# Patient Record
Sex: Female | Born: 1970 | Race: Black or African American | Hispanic: No | Marital: Single | Smoking: Never smoker
Health system: Southern US, Community
[De-identification: ages and names within clinical notes are randomized; demographics above are authoritative.]

## PROBLEM LIST (undated history)

## (undated) DIAGNOSIS — T7840XA Allergy, unspecified, initial encounter: Secondary | ICD-10-CM

## (undated) DIAGNOSIS — I10 Essential (primary) hypertension: Secondary | ICD-10-CM

## (undated) HISTORY — DX: Allergy, unspecified, initial encounter: T78.40XA

## (undated) HISTORY — PX: FOOT SURGERY: SHX648

## (undated) HISTORY — DX: Essential (primary) hypertension: I10

---

## 2001-04-23 ENCOUNTER — Other Ambulatory Visit: Admission: RE | Admit: 2001-04-23 | Discharge: 2001-04-23 | Payer: Self-pay | Admitting: *Deleted

## 2001-07-09 ENCOUNTER — Emergency Department (HOSPITAL_COMMUNITY): Admission: EM | Admit: 2001-07-09 | Discharge: 2001-07-09 | Payer: Self-pay | Admitting: Emergency Medicine

## 2002-05-16 ENCOUNTER — Other Ambulatory Visit: Admission: RE | Admit: 2002-05-16 | Discharge: 2002-05-16 | Payer: Self-pay | Admitting: *Deleted

## 2003-05-22 ENCOUNTER — Other Ambulatory Visit: Admission: RE | Admit: 2003-05-22 | Discharge: 2003-05-22 | Payer: Self-pay | Admitting: *Deleted

## 2003-09-08 ENCOUNTER — Inpatient Hospital Stay (HOSPITAL_COMMUNITY): Admission: AD | Admit: 2003-09-08 | Discharge: 2003-09-08 | Payer: Self-pay | Admitting: Obstetrics and Gynecology

## 2003-11-21 ENCOUNTER — Inpatient Hospital Stay (HOSPITAL_COMMUNITY): Admission: AD | Admit: 2003-11-21 | Discharge: 2003-11-21 | Payer: Self-pay | Admitting: Obstetrics and Gynecology

## 2003-12-04 ENCOUNTER — Inpatient Hospital Stay (HOSPITAL_COMMUNITY): Admission: AD | Admit: 2003-12-04 | Discharge: 2003-12-08 | Payer: Self-pay | Admitting: Obstetrics and Gynecology

## 2003-12-05 ENCOUNTER — Encounter (INDEPENDENT_AMBULATORY_CARE_PROVIDER_SITE_OTHER): Payer: Self-pay | Admitting: *Deleted

## 2003-12-09 ENCOUNTER — Encounter: Admission: RE | Admit: 2003-12-09 | Discharge: 2004-01-08 | Payer: Self-pay | Admitting: Obstetrics and Gynecology

## 2004-01-09 ENCOUNTER — Encounter: Admission: RE | Admit: 2004-01-09 | Discharge: 2004-02-08 | Payer: Self-pay | Admitting: Obstetrics and Gynecology

## 2004-01-15 ENCOUNTER — Other Ambulatory Visit: Admission: RE | Admit: 2004-01-15 | Discharge: 2004-01-15 | Payer: Self-pay | Admitting: Obstetrics and Gynecology

## 2005-01-11 ENCOUNTER — Emergency Department (HOSPITAL_COMMUNITY): Admission: EM | Admit: 2005-01-11 | Discharge: 2005-01-11 | Payer: Self-pay | Admitting: Emergency Medicine

## 2005-02-25 ENCOUNTER — Other Ambulatory Visit: Admission: RE | Admit: 2005-02-25 | Discharge: 2005-02-25 | Payer: Self-pay | Admitting: Obstetrics and Gynecology

## 2009-01-23 ENCOUNTER — Emergency Department (HOSPITAL_COMMUNITY): Admission: EM | Admit: 2009-01-23 | Discharge: 2009-01-24 | Payer: Self-pay | Admitting: Emergency Medicine

## 2009-10-31 ENCOUNTER — Emergency Department (HOSPITAL_COMMUNITY): Admission: EM | Admit: 2009-10-31 | Discharge: 2009-11-01 | Payer: Self-pay | Admitting: Emergency Medicine

## 2010-09-24 NOTE — Discharge Summary (Signed)
[name], [name]                          ACCOUNT NO.:  [id number]   MEDICAL RECORD NO.:  [id number]                   PATIENT TYPE:  INP   LOCATION:  9117                                 FACILITY:  [facility]   PHYSICIAN:  Freddy Finner, M.D.                DATE OF BIRTH:  Mar 14, 1971   DATE OF ADMISSION:  12/04/2003  DATE OF DISCHARGE:  12/08/2003                                 DISCHARGE SUMMARY   ADMITTING DIAGNOSES:  1.  Intrauterine pregnancy at 37 weeks estimated gestational age.  2.  Intrauterine growth retardation with borderline amniotic fluid index.  3.  Positive group B beta streptococcus.   DISCHARGE DIAGNOSES:  1.  Status post low transverse cesarean section secondary to nonreassuring      fetal heart tones.  2.  Viable female infant.   PROCEDURE:  Primary low transverse cesarean section.   REASON FOR ADMISSION:  Please see dictated H&P.   HOSPITAL COURSE:  The patient was a 40 year old African-American single  female gravida 3 para 0 that was admitted to [facility] for  induction of labor.  The patient had been seen in the office earlier that  day and had been placed on the monitor for a nonstress test.  Fetal  deceleration was noted on the monitor.  Biophysical profile had been  performed with scores of 8/8 but AFI was noted to be 7.0 and estimated fetal  weight was less than the 70th percentile with lagging abdominal circumflex  and MCA Dopplers were abnormal.  The patient was then admitted for Cytotec  induction.  On admission fetal heart tones were reactive but shortly after  Cytotec was administered fetal heart tones showed a gradual decrease in  variability and repetitive late decelerations.  Because of the tracing and  inability to tolerate labor, decision was made to proceed with a low  transverse cesarean section.  The patient was then transferred to the  operating room where spinal anesthesia was administered without difficulty.  A low  transverse incision was made with the delivery of a viable female  infant weighing 4 pounds 15 ounces with Apgars of 9 at one minute and 9 at  five minutes.  Umbilical cord pH was 7.26.  The patient tolerated the  procedure well and was taken to the recovery room in stable condition.  Later that morning, the patient was doing well.  She had then been  transferred to the mother-baby unit.  She was complaining about being  hungry.  Vital signs were stable; she was afebrile.  Abdomen was soft with  good return of bowel function.  Fundus was firm and nontender.  Abdominal  dressing was noted to be clean, dry, and intact.  On postoperative day #1  the patient was without complaint.  She was tolerating a regular diet,  ambulating well.  Vital signs were stable; she was afebrile.  Abdominal  dressing had  been removed revealing an incision that was clean, dry, and  intact.  Labs revealed hemoglobin of 11.7; wbc count of 7.1; platelet count  was noted to be 93,000.  On postoperative day #2, the patient did complain  of some dizziness while in the shower.  Vital signs were stable; she was  afebrile.  Abdomen was soft, fundus firm.  Incision was clean, dry, and  intact.  Repeat labs revealed hemoglobin of 12.1; wbc count of 6.9;  platelets were up to 117,000.  On postoperative day #3, the patient was  ambulating well without complaints of dizziness.  Abdomen was soft, fundus  was firm and nontender.  Incision was clean, dry, and intact.  Staples were  removed, discharge instructions were reviewed, and the patient was  discharged home.   CONDITION ON DISCHARGE:  Good.   DIET:  Regular as tolerated.   ACTIVITY:  No heavy lifting, no driving x2 weeks, no vaginal entry.   FOLLOW-UP:  The patient is to follow up in the office in 7-10 days for an  incision check.  She is to call for temperature greater than 100 degrees,  persistent nausea and vomiting, heavy vaginal bleeding, and/or redness or   drainage from the incisional site.   DISCHARGE MEDICATIONS:  1.  Tylox #30 one p.o. q.4-6h. p.r.n.  2.  Motrin 600 mg q.6h.  3.  Prenatal vitamins one p.o. daily.  4.  Colace one p.o. daily p.r.n.     Julio Sicks, N.P.                        Freddy Finner, M.D.    CC/MEDQ  D:  01/08/2004  T:  01/09/2004  Job:  [id number]

## 2010-09-24 NOTE — Op Note (Signed)
[name], [name]                          ACCOUNT NO.:  [id number]   MEDICAL RECORD NO.:  [id number]                   PATIENT TYPE:  INP   LOCATION:  9117                                 FACILITY:  [facility]   PHYSICIAN:  Michelle L. Vincente Poli, M.D.            DATE OF BIRTH:  1970/12/07   DATE OF PROCEDURE:  12/05/2003  DATE OF DISCHARGE:                                 OPERATIVE REPORT   PREOPERATIVE DIAGNOSIS:  Intrauterine pregnancy at term, nonreactive fetal  heart rate.  Intrauterine growth retardation, borderline AFI and positive  group B strep.   POSTOPERATIVE DIAGNOSIS:  Intrauterine pregnancy at term, nonreactive fetal  heart rate.  Intrauterine growth retardation, borderline AFI and positive  group B strep.   OPERATION PERFORMED:  Primary low transverse cesarean section.   SURGEON:  Michelle L. Vincente Poli, M.D.   ANESTHESIA:  Spinal.   ESTIMATED BLOOD LOSS:  500 mL.   FINDINGS:  Female infant in cephalic presentation, Apgars 8 at one and 9 at  five minutes, cord pH of 7.26.   DESCRIPTION OF PROCEDURE:  The patient was taken to the operating room where  she was given a spinal without incident. She was then placed in dorsal  lithotomy position with a leftward tilt. She was prepped and draped in the  usual sterile fashion.  Foley catheter was inserted into the bladder.  A  scalpel was used to make a low transverse incision, carried down to the  fascia.  The fascia was scored in the midline and extended laterally.  The  Pfannenstiel incision was developed, the rectus muscles were separated in  the midline.  The peritoneum was entered bluntly. The peritoneal incision  was then stretched.  The bladder blade was inserted, the lower uterine  segment was identified and a bladder flap was created sharply and digitally.  The bladder blade was readjusted. A low transverse incision was made in the  uterus and the baby was delivered easily with one gentle pull of the vacuum.  The baby  was a female infant with Apgars 8 at one minute and 9 at five  minutes.  The cord was clamped and cut.  The baby was handed to the waiting  pediatrician.  Cord pH was obtained which was 7.26.  Cord blood was then  obtained. The placenta was manually removed and noted to be normal and  intact.  The placenta was sent to pathology for analysis.  The uterus was  exteriorized, cleared of all clots and debris.  The adnexa appeared normal,  the uterus appeared normal.  The uterine incision was closed in one layer  using 0 chromic in continuous running locking stitch. The uterus was  returned to the abdomen and irrigation was performed.  Hemostasis was noted.  The peritoneum was closed using 0 Vicryl in a continuous running stitch.  The rectus muscles were reapproximated using 0 Vicryl.  The fascia was  closed using  0 Vicryl in a continuous running locking stitch starting at  each corner and meeting in the midline.  After irrigation of the  subcutaneous layer, the skin was closed with staples.  All sponge, lap and  instrument counts were correct times two.  The patient tolerated the  procedure well. and went to recovery room in stable condition.                                              Michelle L. Vincente Poli, M.D.   Florestine Avers  D:  12/05/2003  T:  12/05/2003  Job:  [id number]

## 2011-07-25 ENCOUNTER — Other Ambulatory Visit: Payer: Self-pay | Admitting: Obstetrics and Gynecology

## 2012-09-04 ENCOUNTER — Other Ambulatory Visit: Payer: Self-pay | Admitting: Obstetrics and Gynecology

## 2012-09-11 ENCOUNTER — Other Ambulatory Visit: Payer: Self-pay | Admitting: Obstetrics and Gynecology

## 2012-09-11 DIAGNOSIS — R928 Other abnormal and inconclusive findings on diagnostic imaging of breast: Secondary | ICD-10-CM

## 2012-09-27 ENCOUNTER — Ambulatory Visit
Admission: RE | Admit: 2012-09-27 | Discharge: 2012-09-27 | Disposition: A | Discharge status: Home / Self Care | Payer: Managed Care, Other (non HMO) | Source: Ambulatory Visit | Attending: Obstetrics and Gynecology | Admitting: Obstetrics and Gynecology

## 2012-09-27 DIAGNOSIS — R928 Other abnormal and inconclusive findings on diagnostic imaging of breast: Secondary | ICD-10-CM

## 2012-12-10 ENCOUNTER — Encounter (HOSPITAL_COMMUNITY): Payer: Self-pay | Admitting: *Deleted

## 2012-12-10 ENCOUNTER — Emergency Department (HOSPITAL_COMMUNITY)
Admission: EM | Admit: 2012-12-10 | Discharge: 2012-12-10 | Disposition: A | Discharge status: Home / Self Care | Payer: Managed Care, Other (non HMO) | Attending: Emergency Medicine | Admitting: Emergency Medicine

## 2012-12-10 ENCOUNTER — Emergency Department (HOSPITAL_COMMUNITY): Payer: Managed Care, Other (non HMO)

## 2012-12-10 DIAGNOSIS — W1809XA Striking against other object with subsequent fall, initial encounter: Secondary | ICD-10-CM | POA: Insufficient documentation

## 2012-12-10 DIAGNOSIS — S52122A Displaced fracture of head of left radius, initial encounter for closed fracture: Secondary | ICD-10-CM

## 2012-12-10 DIAGNOSIS — Y929 Unspecified place or not applicable: Secondary | ICD-10-CM | POA: Insufficient documentation

## 2012-12-10 DIAGNOSIS — Y9389 Activity, other specified: Secondary | ICD-10-CM | POA: Insufficient documentation

## 2012-12-10 DIAGNOSIS — S52123A Displaced fracture of head of unspecified radius, initial encounter for closed fracture: Secondary | ICD-10-CM | POA: Insufficient documentation

## 2012-12-10 MED ORDER — HYDROCODONE-ACETAMINOPHEN 5-325 MG PO TABS
1.0000 | ORAL_TABLET | Freq: Once | ORAL | Status: AC
  Administered 2012-12-10: 1 via ORAL
  Filled 2012-12-10: qty 1

## 2012-12-10 MED ORDER — HYDROCODONE-ACETAMINOPHEN 5-325 MG PO TABS: 1.0000 | ORAL_TABLET | ORAL | Status: DC | PRN

## 2012-12-10 NOTE — ED Notes (Signed)
Skating around 4 pm, pushed down on left arm, painful, able to get up, continues to have pain from shoulder to hand

## 2012-12-10 NOTE — ED Provider Notes (Signed)
CSN: [id number]     Arrival date & time 12/10/12  0102 History     First MD Initiated Contact with Patient 12/10/12 0135     Chief Complaint  Patient presents with  . Arm Injury    Left   (Consider location/radiation/quality/duration/timing/severity/associated sxs/prior Treatment) HPI Comments: Patient states she was rollerskating around 4:00 this, afternoon.  She was standing still and suddenly fell to the ground, landing on her left arm.  Since that time.  She has had pain in the entire left arm, unable to really locate.  The pain.  She took Tylenol at 4 and at 7 without significant relief.  She denies any numbness or tingling of her arm or hand.  She is range of motion of the shoulder, and wrist, with decreased range of motion at her elbow.  There is no obvious deformity or breaks in the skin  Patient is a 42 y.o. female presenting with arm injury. The history is provided by the patient.  Arm Injury Location:  Elbow Injury: yes   Mechanism of injury: fall   Fall:    Fall occurred:  Standing   Impact surface:  Hard floor   Entrapped after fall: no   Elbow location:  L elbow Pain details:    Quality:  Aching   Radiates to:  L shoulder and L wrist   Severity:  Moderate   Onset quality:  Sudden   Duration:  3 hours   Timing:  Constant   Progression:  Unchanged Chronicity:  New Handedness:  Right-handed Dislocation: no   Foreign body present:  No foreign bodies Prior injury to area:  No Relieved by:  Nothing Worsened by:  Movement Associated symptoms: decreased range of motion   Associated symptoms: no fever     History reviewed. No pertinent past medical history. Past Surgical History  Procedure Laterality Date  . Cesarean section    . Foot surgery     No family history on file. History  Substance Use Topics  . Smoking status: Never Smoker   . Smokeless tobacco: Not on file  . Alcohol Use: Yes     Comment: soc   OB History   Grav Para Term Preterm Abortions TAB  SAB Ect Mult Living                 Review of Systems  Constitutional: Negative for fever and chills.  Musculoskeletal: Negative for joint swelling.  Neurological: Negative for weakness and numbness.  All other systems reviewed and are negative.    Allergies  Review of patient's allergies indicates no known allergies.  Home Medications   Current Outpatient Rx  Name  Route  Sig  Dispense  Refill  . acetaminophen (TYLENOL) 500 MG tablet   Oral   Take 500 mg by mouth every 6 (six) hours as needed for pain (pain).         Marland Kitchen HYDROcodone-acetaminophen (NORCO/VICODIN) 5-325 MG per tablet   Oral   Take 1 tablet by mouth every 4 (four) hours as needed for pain.   30 tablet   0    BP 150/97  Pulse 94  Temp(Src) 98 F (36.7 C) (Oral)  Resp 18  Ht 5\' 8"  (1.727 m)  Wt 151 lb (68.493 kg)  BMI 22.96 kg/m2  SpO2 98% Physical Exam  Nursing note and vitals reviewed. Constitutional: She is oriented to person, place, and time. She appears well-developed and well-nourished.  HENT:  Head: Normocephalic.  Eyes: Pupils are equal, round,  and reactive to light.  Neck: Normal range of motion.  Cardiovascular: Normal rate and regular rhythm.   Pulmonary/Chest: Effort normal and breath sounds normal.  Musculoskeletal: She exhibits tenderness. She exhibits no edema.       Left elbow: She exhibits decreased range of motion. She exhibits no swelling, no deformity and no laceration. Tenderness found.  Neurological: She is alert and oriented to person, place, and time.    ED Course   Procedures (including critical care time)  Labs Reviewed - No data to display Dg Elbow Complete Left  12/10/2012   **ADDENDUM** CREATED: 12/10/2012 01:59:44  Please note that there is a dictation error, this is study with sign and chronically without the original dictated report.  No acute fracture or dislocation is seen about the left elbow. There is no joint effusion.  No soft tissue abnormality.  Osseous  mineralization is normal.  No radiopaque foreign body.  **END ADDENDUM** SIGNED BY: Rise Mu, M.D.  12/10/2012   *RADIOLOGY REPORT*  Clinical Data: Fall.  Left elbow pain.  LEFT ELBOW - COMPLETE 3+ VIEW  Comparison: None.  Findings: The patient has an acute fracture of the neck of the left radius with minimal anterior displacement and impaction.  No other fracture is identified.  Elbow joint effusion is noted.  IMPRESSION: Minimally impacted and anteriorly displaced radial neck fracture with associated elbow joint effusion.   Original Report Authenticated By: Holley Dexter, M.D.   Dg Forearm Left  12/10/2012   *RADIOLOGY REPORT*  Clinical Data: On injury  LEFT FOREARM - 2 VIEW  Comparison: None.  Findings: No acute fracture or dislocation.  Limited views of the left elbow and wrist are unremarkable.  No soft tissue abnormality. Osseous mineralization is normal.  No radiopaque foreign body.  IMPRESSION: Normal radiograph with no acute fracture or dislocation.   Original Report Authenticated By: Rise Mu, M.D.   1. Radial head fracture, closed, left, initial encounter     MDM   wil ontain Xray of elbow and forarm  There are 2 different reading of the results one indicating a fracture the other a more chronic condition but patient has no previous Hx elbow injury Will treat as an acute Fx with ortho follow up in 1-2 days patient has been placed in a sling.  She is told to leave this on at all times for the next 2 days  Arman Filter, NP 12/10/12 [phone]

## 2012-12-11 NOTE — ED Provider Notes (Signed)
Medical screening examination/treatment/procedure(s) were performed by non-physician practitioner and as supervising physician I was immediately available for consultation/collaboration.    Aayat Hajjar R Naidelyn Parrella, MD 12/11/12 0519 

## 2013-06-17 ENCOUNTER — Ambulatory Visit (INDEPENDENT_AMBULATORY_CARE_PROVIDER_SITE_OTHER): Payer: Managed Care, Other (non HMO) | Admitting: Emergency Medicine

## 2013-06-17 ENCOUNTER — Ambulatory Visit: Payer: Managed Care, Other (non HMO)

## 2013-06-17 VITALS — BP 112/80 | HR 82 | Temp 97.4°F | Resp 18 | Ht 68.5 in | Wt 150.0 lb

## 2013-06-17 DIAGNOSIS — R05 Cough: Secondary | ICD-10-CM

## 2013-06-17 DIAGNOSIS — D72819 Decreased white blood cell count, unspecified: Secondary | ICD-10-CM

## 2013-06-17 DIAGNOSIS — R059 Cough, unspecified: Secondary | ICD-10-CM

## 2013-06-17 DIAGNOSIS — R200 Anesthesia of skin: Secondary | ICD-10-CM

## 2013-06-17 DIAGNOSIS — R202 Paresthesia of skin: Secondary | ICD-10-CM

## 2013-06-17 LAB — POCT CBC
Granulocyte percent: 38 %G (ref 37–80)
HEMATOCRIT: 44.6 % (ref 37.7–47.9)
HEMOGLOBIN: 13.8 g/dL (ref 12.2–16.2)
Lymph, poc: 1.7 (ref 0.6–3.4)
MCH, POC: 29 pg (ref 27–31.2)
MCHC: 30.9 g/dL — AB (ref 31.8–35.4)
MCV: 93.8 fL (ref 80–97)
MID (CBC): 0.3 (ref 0–0.9)
MPV: 10.7 fL (ref 0–99.8)
PLATELET COUNT, POC: 212 10*3/uL (ref 142–424)
POC Granulocyte: 1.3 — AB (ref 2–6.9)
POC LYMPH %: 52.4 % — AB (ref 10–50)
POC MID %: 9.6 % (ref 0–12)
RBC: 4.76 M/uL (ref 4.04–5.48)
RDW, POC: 14.1 %
WBC: 3.3 10*3/uL — AB (ref 4.6–10.2)

## 2013-06-17 LAB — POCT SEDIMENTATION RATE: POCT SED RATE: 31 mm/h — AB (ref 0–22)

## 2013-06-17 LAB — POCT INFLUENZA A/B
INFLUENZA A, POC: NEGATIVE
Influenza B, POC: NEGATIVE

## 2013-06-17 NOTE — Progress Notes (Addendum)
Subjective:    Patient ID: [name], female    DOB: 30-Aug-1970, 43 y.o.   MRN: [id number] This chart was scribed for Darlyne Russian, MD by Anastasia Pall, ED Scribe. This patient was seen in room 12 and the patient's care was started at 11:15 AM.  Chief Complaint  Patient presents with  . Cough    3 weeks been on 2 different antibiotics  . Numbness    Burning and tingling in both legs about 3 days    HPI [name] is a 43 y.o. female Pt presents with intermittent, dry cough, onset over 2 weeks ago. She reports being seen by [facility] around 12 days ago, received Tessalon pearls and cough medicine. She reports 10 days ago her cough had subsided, but 2 days later she reports her cough returned in the evening She denies coughing up sputum at that time, but reports gradually increasing green sputum since then. She reports last week having fever, but denies fever over the last few days. She reports receiving Amoxicillin and more cough syrup, without relief. She reports intermittent episodes of LE burning and tingling 4 days ago, and having trouble sleeping due to the episodes waking her up. She denies her LE being sore to the touch. She reports being taken off Amoxicillin, put on Doxycycline. She reports her tingling returned again last night. She reports 3 full days of Doxycycline, but denies taking it today. She reports having flu immunization 12/14. She reports being on Mirena, not having menstrual period in years. She denies possibility of being pregnant.   PCP -  No primary provider on file.  There are no active problems to display for this patient.  Past Medical History  Diagnosis Date  . Allergy    Past Surgical History  Procedure Laterality Date  . Cesarean section    . Foot surgery    . Brain surgery     No Known Allergies Prior to Admission medications   Medication Sig Start Date End Date Taking? Authorizing Provider  doxycycline (VIBRAMYCIN) 100 MG capsule Take  100 mg by mouth 2 (two) times daily.   Yes Historical Provider, MD  acetaminophen (TYLENOL) 500 MG tablet Take 500 mg by mouth every 6 (six) hours as needed for pain (pain).    Historical Provider, MD  amoxicillin (AMOXIL) 875 MG tablet Take 875 mg by mouth 2 (two) times daily.    Historical Provider, MD  guaiFENesin-codeine [facility] C-NR) 100-10 MG/5ML syrup Take 5 mLs by mouth 3 (three) times daily as needed for cough.    Historical Provider, MD   Review of Systems  Constitutional: Positive for fever.  Respiratory: Positive for cough (minimally productive of green sputum).   Musculoskeletal: Negative for myalgias.  Neurological:       + for intermittent burning and tingling in bilateral LE.        Objective:   Physical Exam Nursing note and vitals reviewed. Constitutional: Pt is oriented to person, place, and time. Pt appears well-developed and well-nourished. No distress.  HENT:  Head: Normocephalic and atraumatic.  Eyes: EOM are normal. Pupils are equal, round, and reactive to light.  Neck: Neck supple. Cardiovascular: Normal rate, regular rhythm and normal heart sounds.  Exam reveals no gallop and no friction rub. No murmur heard. Pulmonary/Chest: Effort normal and breath sounds normal. No respiratory distress. Pt has no wheezes. Pt has no rales.  Abdominal:  Musculoskeletal: Normal range of motion. Bilateral LE non tender to palpation.  Neurological: Pt  is alert and oriented to person, place, and time. Reflexes, sensation, and strength normal in bilateral LE.  Skin: Skin is warm and dry.  Psychiatric: Pt has a normal mood and affect. Pt's behavior is normal.   BP 112/80  Pulse 82  Temp(Src) 97.4 F (36.3 C) (Oral)  Resp 18  Ht 5' 8.5" (1.74 m)  Wt 150 lb (68.04 kg)  BMI 22.47 kg/m2  SpO2  [facility] reading (PRIMARY) by  Dr. Everlene Farrier no acute disease  Results for orders placed in visit on 06/17/13  POCT CBC      Result Value Range   WBC 3.3 (*) 4.6 - 10.2 K/uL   Lymph, poc 1.7   0.6 - 3.4   POC LYMPH PERCENT 52.4 (*) 10 - 50 %L   MID (cbc) 0.3  0 - 0.9   POC MID % 9.6  0 - 12 %M   POC Granulocyte 1.3 (*) 2 - 6.9   Granulocyte percent 38.0  37 - 80 %G   RBC 4.76  4.04 - 5.48 M/uL   Hemoglobin 13.8  12.2 - 16.2 g/dL   HCT, POC 44.6  37.7 - 47.9 %   MCV 93.8  80 - 97 fL   MCH, POC 29.0  27 - 31.2 pg   MCHC 30.9 (*) 31.8 - 35.4 g/dL   RDW, POC 14.1     Platelet Count, POC 212  142 - 424 K/uL   MPV 10.7  0 - 99.8 fL  POCT INFLUENZA A/B      Result Value Range   Influenza A, POC Negative     Influenza B, POC Negative         Assessment & Plan:  I told her I was concerned about the numbness in the tops of her thighs. I told her I did not know if this was medication related or related to her illness or a developing neurological problem. I told her I would make an appointment for her to see the neurologist. She was alerted to contact me if she developed any progressive symptoms or worsening of her neurological complaints. I checked the sensation on the thighs of both legs and there was no numbness involving the anterior thighs lower extremities or upper extremities.

## 2013-06-17 NOTE — Patient Instructions (Addendum)
I have made a referral for you to see a neurologist. If you have progressive numbness or weakness in her legs please return to clinic immediately for reevaluation

## 2013-06-18 LAB — HIV ANTIBODY (ROUTINE TESTING W REFLEX): HIV: NONREACTIVE

## 2013-08-14 ENCOUNTER — Ambulatory Visit: Payer: Managed Care, Other (non HMO) | Admitting: Podiatry

## 2013-08-14 ENCOUNTER — Encounter: Payer: Self-pay | Admitting: Podiatry

## 2013-08-14 VITALS — BP 132/94 | HR 84 | Resp 16 | Ht 68.5 in | Wt 150.0 lb

## 2013-08-14 DIAGNOSIS — L84 Corns and callosities: Secondary | ICD-10-CM

## 2013-08-14 NOTE — Progress Notes (Signed)
   Subjective:    Patient ID: [name], female    DOB: 16-May-1970, 43 y.o.   MRN: [id number]  HPI  N corns                           L left 4th lateral, and 5th medial toe                           D and O about September 2014                           C painful rubbing hard skin                           A enclosed shoes                           T hx of debridement and silicone toe sleeves  Patient presents for ongoing debridement of painful hyperkeratotic lesions in the fourth left fifth left toe. She states that she does not want to have surgical treatment at this time. She was last seen for this problem on 09/10/2012  She request gelcaps to apply to the fourth toe   Review of Systems  All other systems reviewed and are negative.      Objective:   Physical Exam  Orientated x20 43 year old black female   Dermatological: Keratoses noted on the fourth and fifth left toes.            Assessment & Plan:   Assessment: Hyperkeratoses fourth and fifth left toes associated with hammertoes  Plan: Keratoses x2 were debrided without a bleeding Gelcaps x3 dispensed at patient's request to apply to to wear in the fourth left toe.  Reappoint at patient's request

## 2013-09-09 ENCOUNTER — Telehealth: Payer: Self-pay | Admitting: *Deleted

## 2013-09-09 NOTE — Telephone Encounter (Signed)
I saw Dr. Amalia Hailey a few weeks ago.  I know I need surgery.  Can I get some general information?  How long will I be out of work?  I returned her call.  I asked her if she stands or sits on her job.  She stated she sits.  I informed her she should be able to go back to work after about a week as long as she can sit and elevate her foot.  She asked how she could do that.  I told her bring another chair or stool to her desk and prop the foot on it.  She stated that's all she needed to know.  She said she knows Dr. Amalia Hailey no longer does surgery.  She said she would check her calendar and call back to schedule a consultation with Dr. Blenda Mounts, who Dr. Amalia Hailey referred her to.

## 2013-10-31 ENCOUNTER — Other Ambulatory Visit: Payer: Self-pay | Admitting: Obstetrics and Gynecology

## 2013-11-01 LAB — CYTOLOGY - PAP

## 2014-05-08 IMAGING — CR DG FOREARM 2V*L*
2 series · 2 of 2 positions shown · non-contrast
Comparison: None.

CLINICAL DATA: On injury

LEFT FOREARM - 2 VIEW

[x forearm ap left]
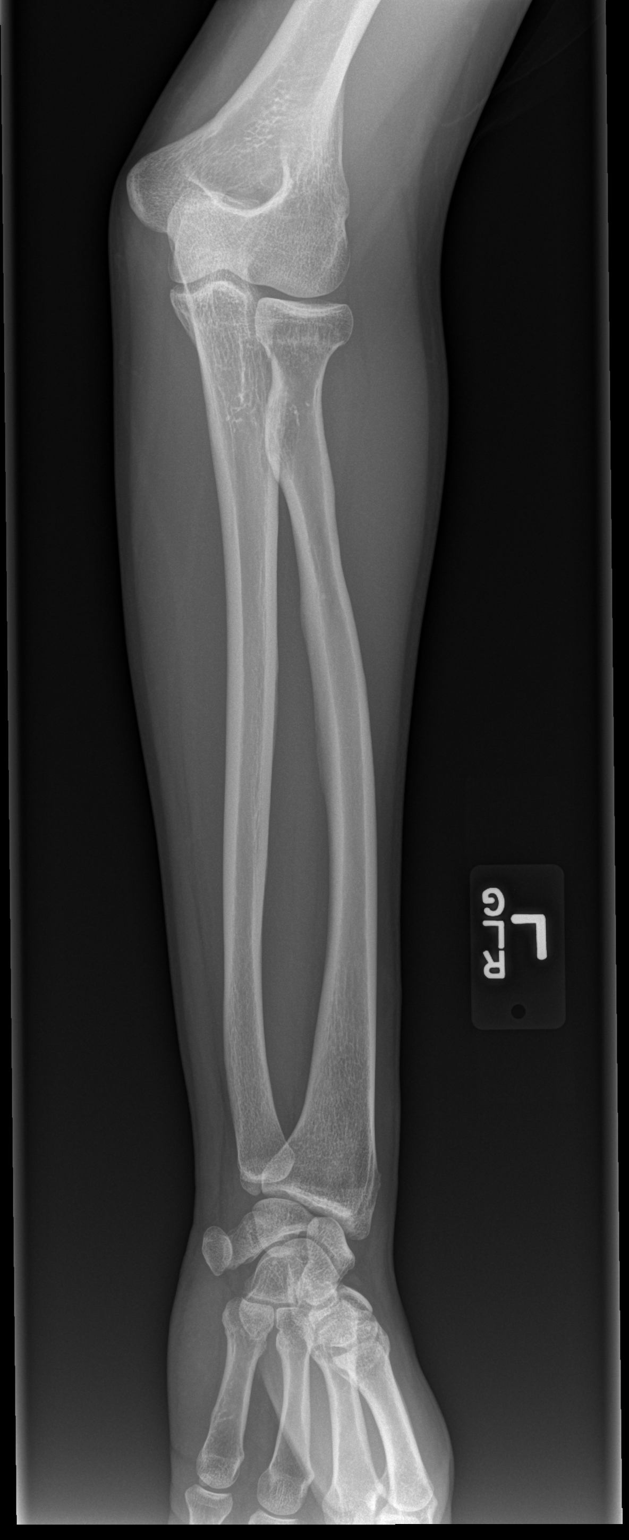

[x forearm lat left]
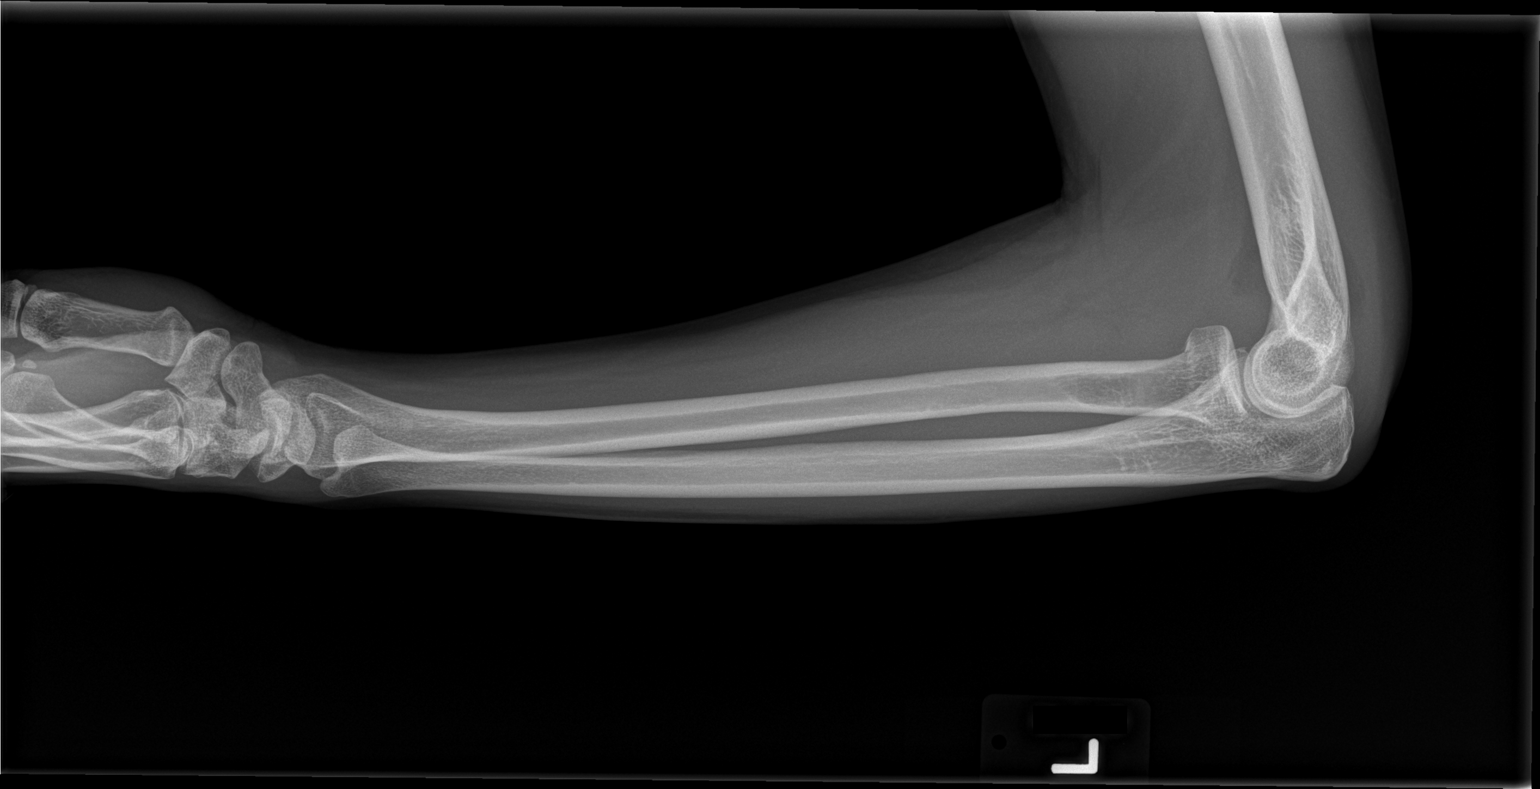

[2 of 2 positions shown; findings below may reference images not displayed]

FINDINGS: No acute fracture or dislocation.  Limited views of the
left elbow and wrist are unremarkable.  No soft tissue abnormality.
Osseous mineralization is normal.  No radiopaque foreign body.
IMPRESSION: Normal radiograph with no acute fracture or dislocation.

## 2014-11-03 ENCOUNTER — Other Ambulatory Visit: Payer: Self-pay | Admitting: Obstetrics and Gynecology

## 2014-11-04 LAB — CYTOLOGY - PAP

## 2014-11-13 IMAGING — CR DG CHEST 2V
2 series · 2 of 2 positions shown · non-contrast
Comparison: None

CLINICAL DATA: Cough.

EXAM:
CHEST - 2 VIEW

[lateral]
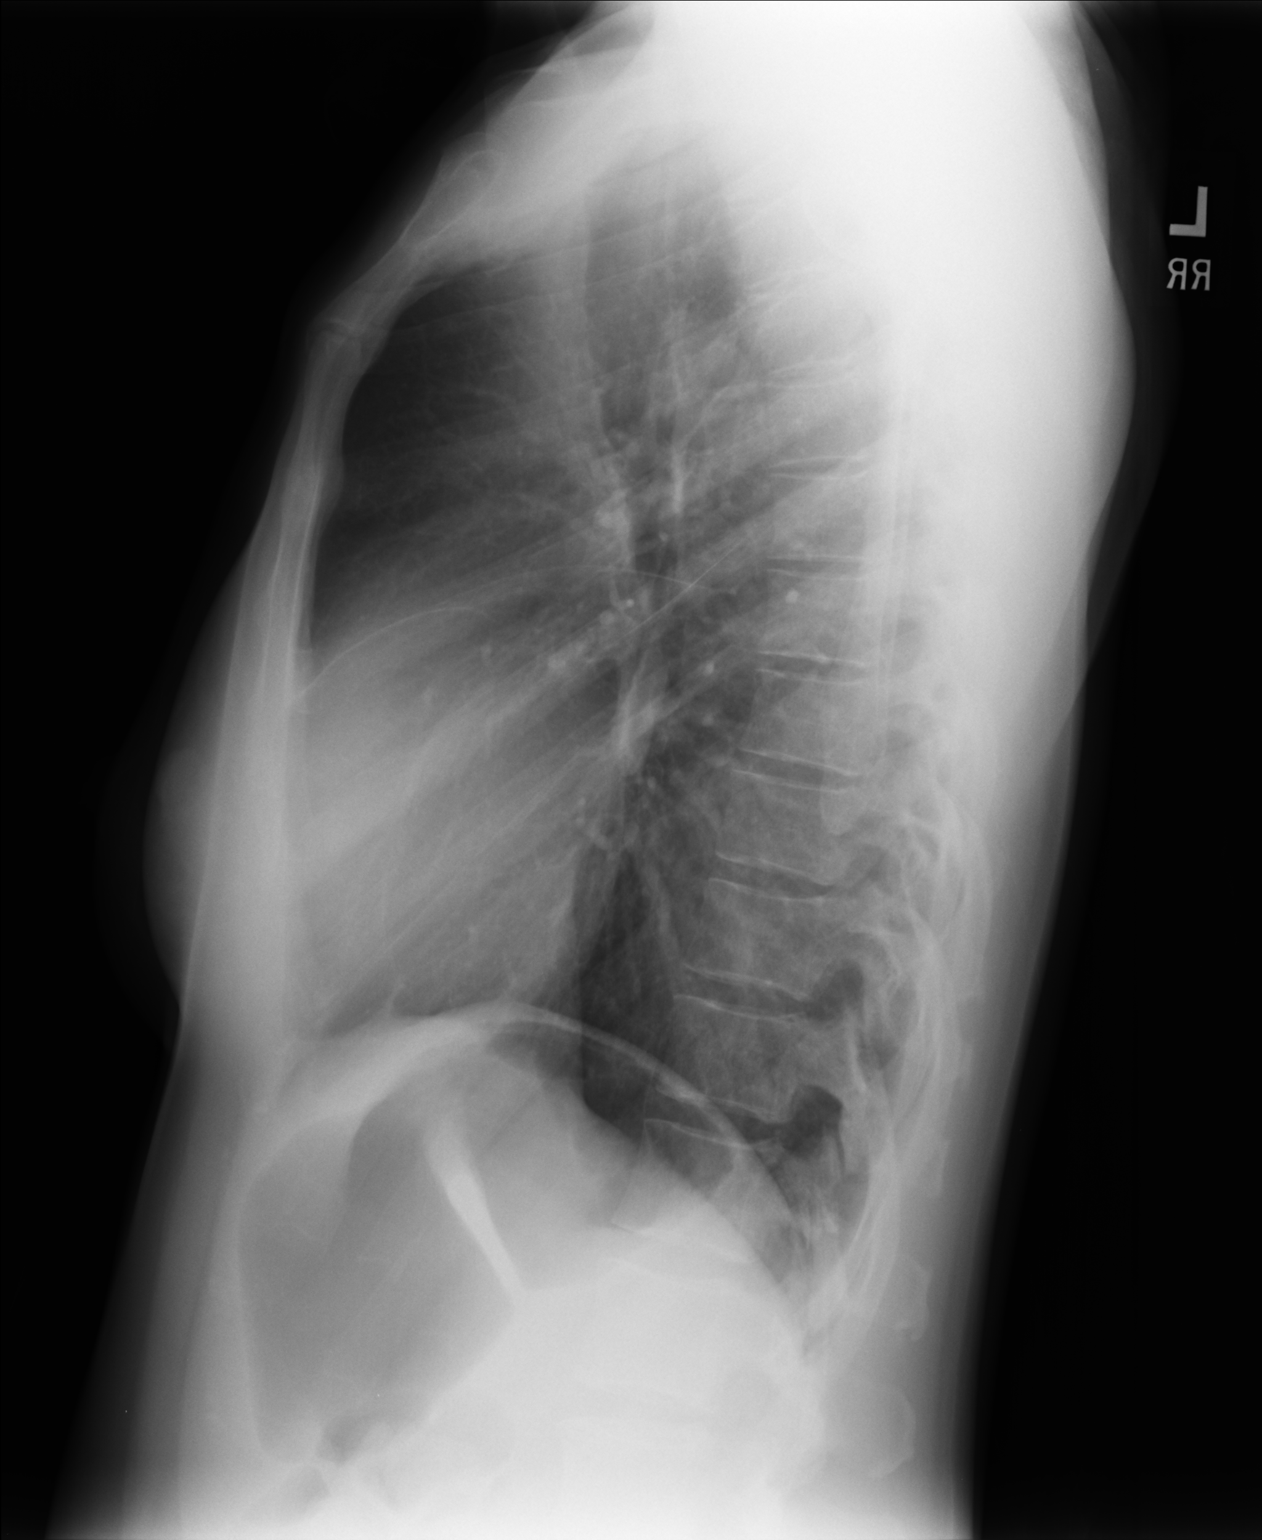

[PA]
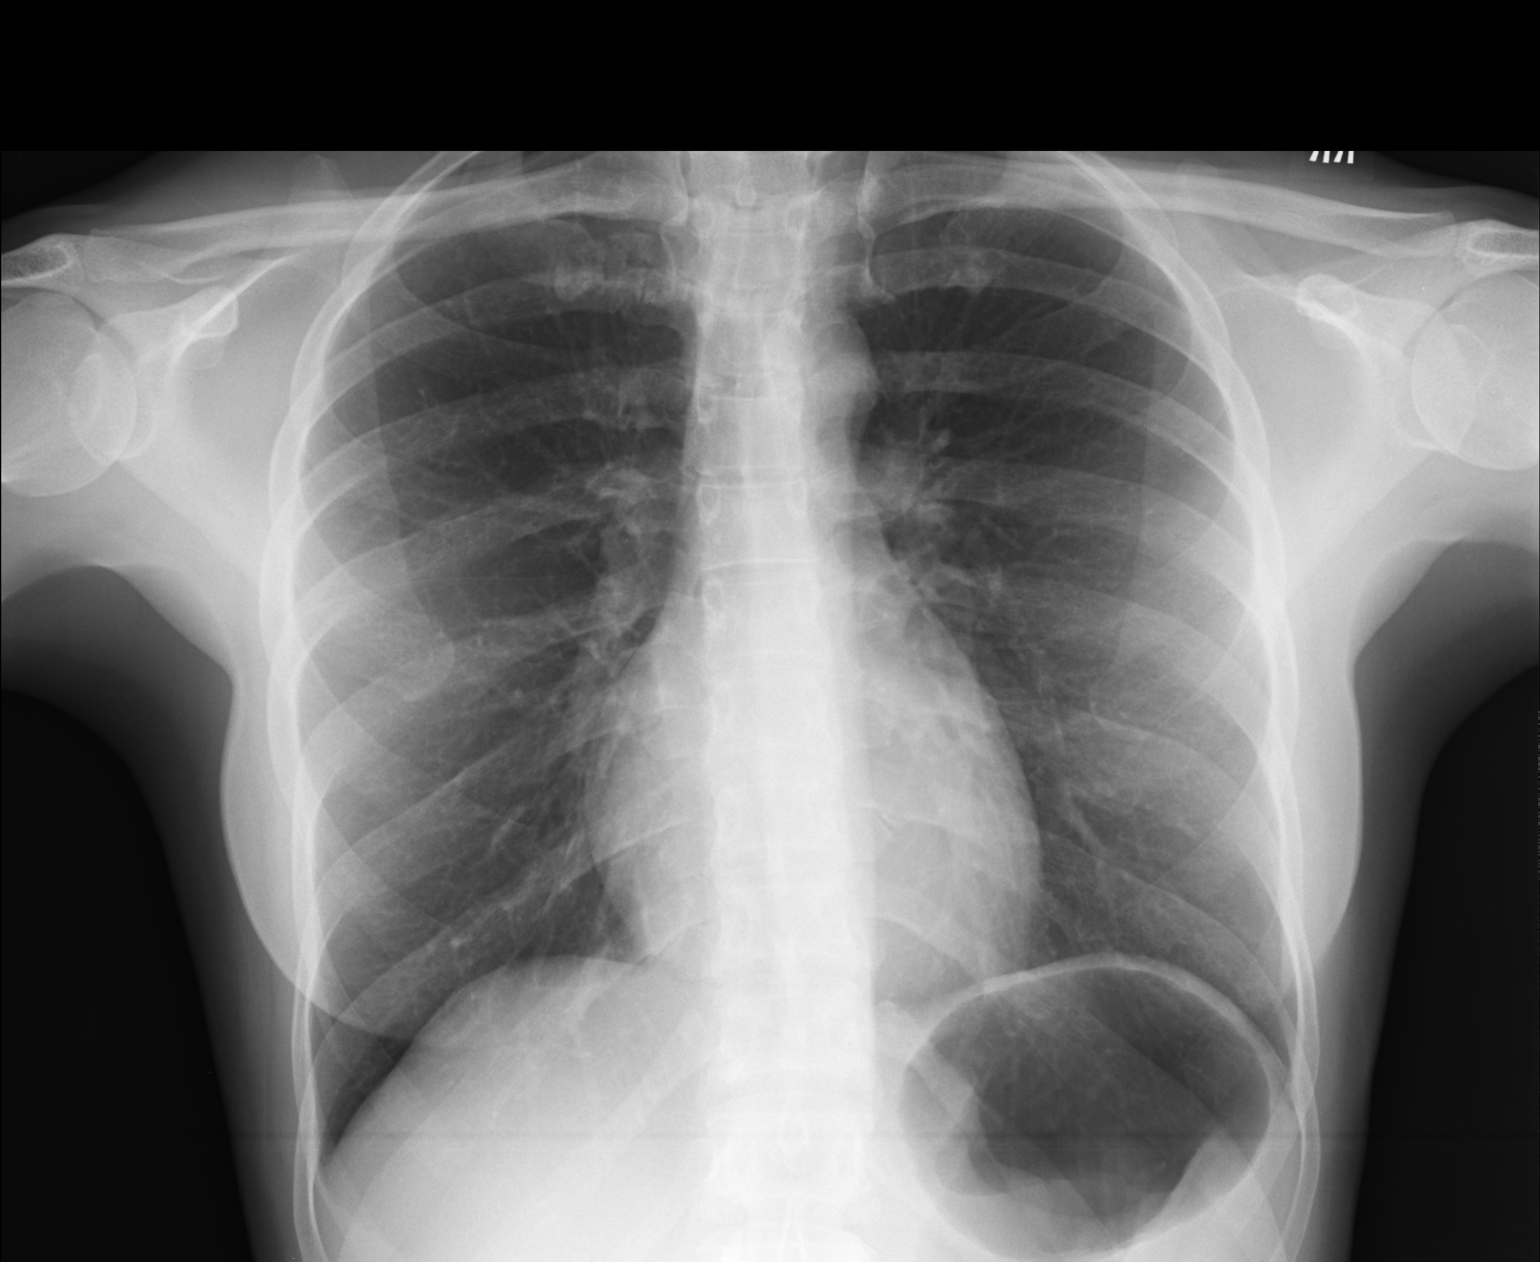

[2 of 2 positions shown; findings below may reference images not displayed]

FINDINGS: The heart size and mediastinal contours are within normal limits.
There is no evidence of pulmonary edema, consolidation,
pneumothorax, nodule or pleural fluid. The visualized skeletal
structures are unremarkable.
IMPRESSION: No active disease.

## 2015-10-20 ENCOUNTER — Ambulatory Visit: Payer: Self-pay | Admitting: Physician Assistant

## 2015-10-21 ENCOUNTER — Encounter: Payer: Self-pay | Admitting: Physician Assistant

## 2015-10-21 ENCOUNTER — Ambulatory Visit (INDEPENDENT_AMBULATORY_CARE_PROVIDER_SITE_OTHER): Payer: Managed Care, Other (non HMO) | Admitting: Physician Assistant

## 2015-10-21 VITALS — BP 118/64 | HR 71 | Temp 97.9°F | Resp 16 | Ht 68.5 in | Wt 150.6 lb

## 2015-10-21 DIAGNOSIS — R5383 Other fatigue: Secondary | ICD-10-CM

## 2015-10-21 DIAGNOSIS — G4700 Insomnia, unspecified: Secondary | ICD-10-CM | POA: Diagnosis not present

## 2015-10-21 DIAGNOSIS — I10 Essential (primary) hypertension: Secondary | ICD-10-CM

## 2015-10-21 DIAGNOSIS — Z79899 Other long term (current) drug therapy: Secondary | ICD-10-CM

## 2015-10-21 DIAGNOSIS — Z130 Encounter for screening for diseases of the blood and blood-forming organs and certain disorders involving the immune mechanism: Secondary | ICD-10-CM

## 2015-10-21 DIAGNOSIS — T7840XA Allergy, unspecified, initial encounter: Secondary | ICD-10-CM | POA: Insufficient documentation

## 2015-10-21 DIAGNOSIS — Z136 Encounter for screening for cardiovascular disorders: Secondary | ICD-10-CM

## 2015-10-21 DIAGNOSIS — Z13220 Encounter for screening for lipoid disorders: Secondary | ICD-10-CM

## 2015-10-21 DIAGNOSIS — R6889 Other general symptoms and signs: Secondary | ICD-10-CM | POA: Diagnosis not present

## 2015-10-21 DIAGNOSIS — E559 Vitamin D deficiency, unspecified: Secondary | ICD-10-CM

## 2015-10-21 DIAGNOSIS — Z0001 Encounter for general adult medical examination with abnormal findings: Secondary | ICD-10-CM | POA: Diagnosis not present

## 2015-10-21 DIAGNOSIS — Z1389 Encounter for screening for other disorder: Secondary | ICD-10-CM

## 2015-10-21 DIAGNOSIS — Z9109 Other allergy status, other than to drugs and biological substances: Secondary | ICD-10-CM | POA: Insufficient documentation

## 2015-10-21 DIAGNOSIS — Z0000 Encounter for general adult medical examination without abnormal findings: Secondary | ICD-10-CM

## 2015-10-21 DIAGNOSIS — Z131 Encounter for screening for diabetes mellitus: Secondary | ICD-10-CM

## 2015-10-21 LAB — CBC WITH DIFFERENTIAL/PLATELET
BASOS PCT: 1 %
Basophils Absolute: 40 cells/uL (ref 0–200)
EOS ABS: 40 {cells}/uL (ref 15–500)
Eosinophils Relative: 1 %
HEMATOCRIT: 40 % (ref 35.0–45.0)
HEMOGLOBIN: 13.4 g/dL (ref 11.7–15.5)
LYMPHS ABS: 1640 {cells}/uL (ref 850–3900)
Lymphocytes Relative: 41 %
MCH: 29.8 pg (ref 27.0–33.0)
MCHC: 33.5 g/dL (ref 32.0–36.0)
MCV: 88.9 fL (ref 80.0–100.0)
MONO ABS: 280 {cells}/uL (ref 200–950)
MPV: 10.5 fL (ref 7.5–12.5)
Monocytes Relative: 7 %
NEUTROS ABS: 2000 {cells}/uL (ref 1500–7800)
Neutrophils Relative %: 50 %
Platelets: 189 10*3/uL (ref 140–400)
RBC: 4.5 MIL/uL (ref 3.80–5.10)
RDW: 13.7 % (ref 11.0–15.0)
WBC: 4 10*3/uL (ref 3.8–10.8)

## 2015-10-21 MED ORDER — TRAZODONE HCL 50 MG PO TABS: ORAL_TABLET | ORAL | Status: DC

## 2015-10-21 NOTE — Patient Instructions (Addendum)
Can try melatonin 5mg -15 mg at night for sleep get the dissolvable or gummy, can also do benadryl 25-50mg  at night for sleep.  If this does not help we can try prescription medication.  Also here is some information about good sleep hygiene.   Insomnia Insomnia is frequent trouble falling and/or staying asleep. Insomnia can be a long term problem or a short term problem. Both are common. Insomnia can be a short term problem when the wakefulness is related to a certain stress or worry. Long term insomnia is often related to ongoing stress during waking hours and/or poor sleeping habits. Overtime, sleep deprivation itself can make the problem worse. Every little thing feels more severe because you are overtired and your ability to cope is decreased. CAUSES   Stress, anxiety, and depression.  Poor sleeping habits.  Distractions such as TV in the bedroom.  Naps close to bedtime.  Engaging in emotionally charged conversations before bed.  Technical reading before sleep.  Alcohol and other sedatives. They may make the problem worse. They can hurt normal sleep patterns and normal dream activity.  Stimulants such as caffeine for several hours prior to bedtime.  Pain syndromes and shortness of breath can cause insomnia.  Exercise late at night.  Changing time zones may cause sleeping problems (jet lag). It is sometimes helpful to have someone observe your sleeping patterns. They should look for periods of not breathing during the night (sleep apnea). They should also look to see how long those periods last. If you live alone or observers are uncertain, you can also be observed at a sleep clinic where your sleep patterns will be professionally monitored. Sleep apnea requires a checkup and treatment. Give your caregivers your medical history. Give your caregivers observations your family has made about your sleep.  SYMPTOMS   Not feeling rested in the morning.  Anxiety and restlessness at  bedtime.  Difficulty falling and staying asleep. TREATMENT   Your caregiver may prescribe treatment for an underlying medical disorders. Your caregiver can give advice or help if you are using alcohol or other drugs for self-medication. Treatment of underlying problems will usually eliminate insomnia problems.  Medications can be prescribed for short time use. They are generally not recommended for lengthy use.  Over-the-counter sleep medicines are not recommended for lengthy use. They can be habit forming.  You can promote easier sleeping by making lifestyle changes such as:  Using relaxation techniques that help with breathing and reduce muscle tension.  Exercising earlier in the day.  Changing your diet and the time of your last meal. No night time snacks.  Establish a regular time to go to bed.  Counseling can help with stressful problems and worry.  Soothing music and white noise may be helpful if there are background noises you cannot remove.  Stop tedious detailed work at least one hour before bedtime. HOME CARE INSTRUCTIONS   Keep a diary. Inform your caregiver about your progress. This includes any medication side effects. See your caregiver regularly. Take note of:  Times when you are asleep.  Times when you are awake during the night.  The quality of your sleep.  How you feel the next day. This information will help your caregiver care for you.  Get out of bed if you are still awake after 15 minutes. Read or do some quiet activity. Keep the lights down. Wait until you feel sleepy and go back to bed.  Keep regular sleeping and waking hours. Avoid naps.  Exercise regularly.  Avoid distractions at bedtime. Distractions include watching television or engaging in any intense or detailed activity like attempting to balance the household checkbook.  Develop a bedtime ritual. Keep a familiar routine of bathing, brushing your teeth, climbing into bed at the same time  each night, listening to soothing music. Routines increase the success of falling to sleep faster.  Use relaxation techniques. This can be using breathing and muscle tension release routines. It can also include visualizing peaceful scenes. You can also help control troubling or intruding thoughts by keeping your mind occupied with boring or repetitive thoughts like the old concept of counting sheep. You can make it more creative like imagining planting one beautiful flower after another in your backyard garden.  During your day, work to eliminate stress. When this is not possible use some of the previous suggestions to help reduce the anxiety that accompanies stressful situations. MAKE SURE YOU:   Understand these instructions.  Will watch your condition.  Will get help right away if you are not doing well or get worse. Document Released: 04/22/2000 Document Revised: 07/18/2011 Document Reviewed: 05/23/2007 [facility] Patient Information 2015 [place], [place]. This information is not intended to replace advice given to you by your health care provider. Make sure you discuss any questions you have with your health care provider.  Fatigue Fatigue is feeling tired all of the time, a lack of energy, or a lack of motivation. Occasional or mild fatigue is often a normal response to activity or life in general. However, long-lasting (chronic) or extreme fatigue may indicate an underlying medical condition. HOME CARE INSTRUCTIONS  Watch your fatigue for any changes. The following actions may help to lessen any discomfort you are feeling: Talk to your health care provider about how much sleep you need each night. Try to get the required amount every night. Take medicines only as directed by your health care provider. Eat a healthy and nutritious diet. Ask your health care provider if you need help changing your diet. Drink enough fluid to keep your urine clear or pale yellow. Practice ways of relaxing,  such as yoga, meditation, massage therapy, or acupuncture. Exercise regularly.  Change situations that cause you stress. Try to keep your work and personal routine reasonable. Do not abuse illegal drugs. Limit alcohol intake to no more than 1 drink per day for nonpregnant women and 2 drinks per day for men. One drink equals 12 ounces of beer, 5 ounces of wine, or 1 ounces of hard liquor. Take a multivitamin, if directed by your health care provider. SEEK MEDICAL CARE IF:  Your fatigue does not get better. You have a fever.  You have unintentional weight loss or gain. You have headaches.  You have difficulty:  Falling asleep. Sleeping throughout the night. You feel angry, guilty, anxious, or sad.  You are unable to have a bowel movement (constipation).  You skin is dry.  Your legs or another part of your body is swollen.  SEEK IMMEDIATE MEDICAL CARE IF:  You feel confused.  Your vision is blurry. You feel faint or pass out.  You have a severe headache.  You have severe abdominal, pelvic, or back pain.  You have chest pain, shortness of breath, or an irregular or fast heartbeat.  You are unable to urinate or you urinate less than normal.  You develop abnormal bleeding, such as bleeding from the rectum, vagina, nose, lungs, or nipples. You vomit blood.  You have thoughts about harming yourself or  committing suicide.  You are worried that you might harm someone else.    This information is not intended to replace advice given to you by your health care provider. Make sure you discuss any questions you have with your health care provider.   Document Released: 02/20/2007 Document Revised: 05/16/2014 Document Reviewed: 08/27/2013 Elsevier Interactive Patient Education [employer].

## 2015-10-21 NOTE — Progress Notes (Signed)
Complete Physical  Assessment and Plan: 1. Screening cholesterol level - Lipid panel  2. Screening for blood or protein in urine - Urinalysis, Routine w reflex microscopic (not at [facility]) - Microalbumin / creatinine urine ratio  3. Screening for diabetes mellitus - Hemoglobin A1c - Insulin, fasting  4. Vitamin D deficiency - VITAMIN D 25 Hydroxy (Vit-D Deficiency, Fractures)  5. Insomnia - will try trazodone, if not tolerated will try belsomra  6. Screening for deficiency anemia - Iron and TIBC - Ferritin - Vitamin B12  7. Routine general medical examination at a health care facility - CBC with Differential/Platelet - BASIC METABOLIC PANEL WITH GFR - Hepatic function panel - TSH - Lipid panel - Hemoglobin A1c - Insulin, fasting - Magnesium - VITAMIN D 25 Hydroxy (Vit-D Deficiency, Fractures) - Urinalysis, Routine w reflex microscopic (not at [facility]) - Microalbumin / creatinine urine ratio - Iron and TIBC - Ferritin - Vitamin B12 - EKG 12-Lead  8. Other fatigue - CBC with Differential/Platelet - BASIC METABOLIC PANEL WITH GFR - Hepatic function panel - TSH - Magnesium - EKG 12-Lead  Discussed med's effects and SE's. Screening labs and tests as requested with regular follow-up as recommended. Over 40 minutes of exam, counseling, chart review and critical decision making was performed  HPI  This very nice 45 y.o.female presents for complete physical.  Patient has no major health issues.  Patient reports no complaints at this time.  She does not workout.  Finally, patient has history of Vitamin D Deficiency.  She state she has been having trouble sleeping for years, worse the last year. Feels like her brain does not shut off, will wake up 4-5 times a night, will have a hard time staying asleep. States she has fatigue in the AM and through the day.   Current Medications:  Current Outpatient Prescriptions on File Prior to Visit  Medication Sig Dispense Refill  .  Cetirizine HCl (ZYRTEC ALLERGY PO) Take by mouth.     No current facility-administered medications on file prior to visit.   Health Maintenance:   TD/TDAP: 2011 Influenza: declines Pneumovax:  Prevnar 13:   LMP: No LMP recorded. Patient is not currently having periods (Reason: IUD). Sexually Active: yes STD testing offered Pap: goes to GYN, Dr. Candie Mile MGM: Goes GYN  Allergies: No Known Allergies Medical History:  Past Medical History  Diagnosis Date  . Allergy    Surgical History:  Past Surgical History  Procedure Laterality Date  . Cesarean section    . Foot surgery     Family History:  Family History  Problem Relation Age of Onset  . Diabetes Father   . Hypertension Father   . Hyperlipidemia Father   . Cancer Maternal Grandmother 60    Pancreatic  . Stroke Maternal Grandfather   . Hypertension Maternal Grandfather   . Hypertension Paternal Grandmother   . Stroke Paternal Grandmother   . Diabetes Paternal Grandfather   . Cancer Maternal Aunt     hodkins lympoma  . Kidney disease Paternal Uncle     CKD, had transplant   Social History:  Social History  Substance Use Topics  . Smoking status: Never Smoker   . Smokeless tobacco: Never Used  . Alcohol Use: 0.0 oz/week    0 Standard drinks or equivalent per week     Comment: socially   Review of Systems: Review of Systems  Constitutional: Positive for malaise/fatigue. Negative for fever, chills, weight loss and diaphoresis.  HENT: Negative.   Eyes:  Negative.   Respiratory: Negative.   Cardiovascular: Negative.   Gastrointestinal: Negative.   Genitourinary: Negative.   Musculoskeletal: Negative.   Skin: Negative.   Neurological: Negative.  Negative for weakness.  Endo/Heme/Allergies: Negative.   Psychiatric/Behavioral: Negative for depression, suicidal ideas, hallucinations, memory loss and substance abuse. The patient has insomnia. The patient is not nervous/anxious.     Physical Exam: Estimated  body mass index is 22.56 kg/(m^2) as calculated from the following:   Height as of this encounter: 5' 8.5" (1.74 m).   Weight as of this encounter: 150 lb 9.6 oz (68.312 kg). BP 118/64 mmHg  Pulse 71  Temp(Src) 97.9 F (36.6 C) (Temporal)  Resp 16  Ht 5' 8.5" (1.74 m)  Wt 150 lb 9.6 oz (68.312 kg)  BMI 22.56 kg/m2  SpO2 99% General Appearance: Well nourished, in no apparent distress.  Eyes: PERRLA, EOMs, conjunctiva no swelling or erythema, normal fundi and vessels.  Sinuses: No Frontal/maxillary tenderness  ENT/Mouth: Ext aud canals clear, normal light reflex with TMs without erythema, bulging. Good dentition. No erythema, swelling, or exudate on post pharynx. Tonsils not swollen or erythematous. Hearing normal.  Neck: Supple, thyroid normal. No bruits  Respiratory: Respiratory effort normal, BS equal bilaterally without rales, rhonchi, wheezing or stridor.  Cardio: RRR without murmurs, rubs or gallops. Brisk peripheral pulses without edema.  Chest: symmetric, with normal excursions and percussion.  Breasts: Symmetric, without lumps, nipple discharge, retractions.  Abdomen: Soft, nontender, no guarding, rebound, hernias, masses, or organomegaly.  Lymphatics: Non tender without lymphadenopathy.  Genitourinary: defer Musculoskeletal: Full ROM all peripheral extremities,5/5 strength, and normal gait.  Skin: Warm, dry without rashes, lesions, ecchymosis. Neuro: Cranial nerves intact, reflexes equal bilaterally. Normal muscle tone, no cerebellar symptoms. Sensation intact.  Psych: Awake and oriented X 3, normal affect, Insight and Judgment appropriate.   EKG: WNL  Vicie Mutters 11:54 AM [facility]

## 2015-10-22 LAB — URINALYSIS, ROUTINE W REFLEX MICROSCOPIC
BILIRUBIN URINE: NEGATIVE
GLUCOSE, UA: NEGATIVE
Hgb urine dipstick: NEGATIVE
Ketones, ur: NEGATIVE
Leukocytes, UA: NEGATIVE
Nitrite: NEGATIVE
Protein, ur: NEGATIVE
SPECIFIC GRAVITY, URINE: 1.019 (ref 1.001–1.035)
pH: 6.5 (ref 5.0–8.0)

## 2015-10-22 LAB — HEMOGLOBIN A1C
HEMOGLOBIN A1C: 5.5 % (ref <5.7)
Mean Plasma Glucose: 111 mg/dL

## 2015-10-22 LAB — TSH: TSH: 1.4 mIU/L

## 2015-10-22 LAB — VITAMIN D 25 HYDROXY (VIT D DEFICIENCY, FRACTURES): Vit D, 25-Hydroxy: 13 ng/mL — ABNORMAL LOW (ref 30–100)

## 2015-10-22 LAB — FERRITIN: FERRITIN: 83 ng/mL (ref 10–232)

## 2015-10-22 LAB — INSULIN, FASTING: INSULIN FASTING, SERUM: 1.5 u[IU]/mL — AB (ref 2.0–19.6)

## 2015-10-22 LAB — VITAMIN B12: Vitamin B-12: 305 pg/mL (ref 200–1100)

## 2015-10-23 LAB — HEPATIC FUNCTION PANEL
ALBUMIN: 4.3 g/dL (ref 3.6–5.1)
ALK PHOS: 57 U/L (ref 33–115)
ALT: 6 U/L (ref 6–29)
AST: 12 U/L (ref 10–30)
BILIRUBIN INDIRECT: 0.4 mg/dL (ref 0.2–1.2)
Bilirubin, Direct: 0.1 mg/dL (ref <=0.2)
TOTAL PROTEIN: 7.1 g/dL (ref 6.1–8.1)
Total Bilirubin: 0.5 mg/dL (ref 0.2–1.2)

## 2015-10-23 LAB — BASIC METABOLIC PANEL WITH GFR
BUN: 12 mg/dL (ref 7–25)
CHLORIDE: 103 mmol/L (ref 98–110)
CO2: 23 mmol/L (ref 20–31)
Calcium: 9.5 mg/dL (ref 8.6–10.2)
Creat: 0.96 mg/dL (ref 0.50–1.10)
GFR, EST NON AFRICAN AMERICAN: 73 mL/min (ref >=60)
GFR, Est African American: 84 mL/min (ref >=60)
GLUCOSE: 81 mg/dL (ref 65–99)
POTASSIUM: 4.5 mmol/L (ref 3.5–5.3)
Sodium: 141 mmol/L (ref 135–146)

## 2015-10-23 LAB — MICROALBUMIN / CREATININE URINE RATIO
Creatinine, Urine: 202 mg/dL (ref 20–320)
MICROALB/CREAT RATIO: 3 ug/mg{creat} (ref <30)
Microalb, Ur: 0.7 mg/dL

## 2015-10-23 LAB — LIPID PANEL
Cholesterol: 163 mg/dL (ref 125–200)
HDL: 83 mg/dL (ref >=46)
LDL CALC: 64 mg/dL (ref <130)
Total CHOL/HDL Ratio: 2 Ratio (ref <=5.0)
Triglycerides: 78 mg/dL (ref <150)
VLDL: 16 mg/dL (ref <30)

## 2015-10-23 LAB — IRON AND TIBC
%SAT: 39 % (ref 11–50)
IRON: 98 ug/dL (ref 40–190)
TIBC: 253 ug/dL (ref 250–450)
UIBC: 155 ug/dL (ref 125–400)

## 2015-10-23 LAB — MAGNESIUM: MAGNESIUM: 2.1 mg/dL (ref 1.5–2.5)

## 2016-05-17 ENCOUNTER — Encounter: Payer: Self-pay | Admitting: Podiatry

## 2016-05-17 ENCOUNTER — Ambulatory Visit (INDEPENDENT_AMBULATORY_CARE_PROVIDER_SITE_OTHER): Payer: Managed Care, Other (non HMO) | Admitting: Podiatry

## 2016-05-17 DIAGNOSIS — M2042 Other hammer toe(s) (acquired), left foot: Secondary | ICD-10-CM | POA: Diagnosis not present

## 2016-05-17 NOTE — Patient Instructions (Signed)
Today we discussed treatment options for hammer toes with associated corns. Surgical treatment could be an option. If you choose surgery scheduled point with either Dr. Amalia Hailey or Dr. Charlyne Mom Toe Hammer toe is a change in the shape (a deformity) of your second, third, or fourth toe. The deformity causes the middle joint of your toe to stay bent. This causes pain, especially when you are wearing shoes. Hammer toe starts gradually. At first, the toe can be straightened. Gradually over time, the deformity becomes stiff and permanent. Early treatments to keep the toe straight may relieve pain. As the deformity becomes stiff and permanent, surgery may be needed to straighten the toe. What are the causes? Hammer toe is caused by abnormal bending of the toe joint that is closest to your foot. It happens gradually over time. This pulls on the muscles and connections (tendons) of the toe joint, making them weak and stiff. It is often related to wearing shoes that are too short or narrow and do not let your toes straighten. What increases the risk? You may be at greater risk for hammer toe if you:  Are female.  Are older.  Wear shoes that are too small.  Wear high-heeled shoes that pinch your toes.  Are a Engineer, mining.  Have a second toe that is longer than your big toe (first toe).  Injure your foot or toe.  Have arthritis.  Have a family history of hammer toe.  Have a nerve or muscle disorder. What are the signs or symptoms? The main symptoms of this condition are pain and deformity of the toe. The pain is worse when wearing shoes, walking, or running. Other symptoms may include:  Corns or calluses over the bent part of the toe or between the toes.  Redness and a burning feeling on the toe.  An open sore that forms on the top of the toe.  Not being able to straighten the toe. How is this diagnosed? This condition is diagnosed based on your symptoms and a physical exam. During  the exam, your health care provider will try to straighten your toe to see how stiff the deformity is. You may also have tests, such as:  A blood test to check for rheumatoid arthritis.  An X-ray to show how severe the deformity is. How is this treated? Treatment for this condition will depend on how stiff the deformity is. Surgery is often needed. However, sometimes a hammer toe can be straightened without surgery. Treatments that do not involve surgery include:  Taping the toe into a straightened position.  Using pads and cushions to protect the toe (orthotics).  Wearing shoes that provide enough room for the toes.  Doing toe-stretching exercises at home.  Taking an NSAID to reduce pain and swelling. If these treatments do not help or the toe cannot be straightened, surgery is the next option. The most common surgeries used to straighten a hammer toe include:  Arthroplasty. In this procedure, part of the joint is removed, and that allows the toe to straighten.  Fusion. In this procedure, cartilage between the two bones of the joint is taken out and the bones are fused together into one longer bone.  Implantation. In this procedure, part of the bone is removed and replaced with an implant to let the toe move again.  Flexor tendon transfer. In this procedure, the tendons that curl the toes down (flexor tendons) are repositioned. Follow these instructions at home:  Take over-the-counter and prescription  medicines only as told by your health care provider.  Do toe straightening and stretching exercises as told by your health care provider.  Keep all follow-up visits as told by your health care provider. This is important. How is this prevented?  Wear shoes that give your toes enough room and do not cause pain.  Do not wear high-heeled shoes. Contact a health care provider if:  Your pain gets worse.  Your toe becomes red or swollen.  You develop an open sore on your  toe. This information is not intended to replace advice given to you by your health care provider. Make sure you discuss any questions you have with your health care provider. Document Released: 04/22/2000 Document Revised: 11/13/2015 Document Reviewed: 08/19/2015 Elsevier Interactive Patient Education  2017 [employer].

## 2016-05-17 NOTE — Progress Notes (Signed)
   Subjective:    Patient ID: [name], female    DOB: 10/05/1970, 46 y.o.   MRN: [id number]  HPI This patient presents today complaining of painful corns on the fourth and fifth left toes increasing gradually in discomfort over the last several months. Patient has had symptoms since 2015 and self treats the corns on the fourth and fifth left toes with medicated corn pads, however, is noticing that she's having less relief from this treatment. Also, patient uses toe protectors as well. Patient has a history of hammertoe repair fourth and fifth right multiple years ago for similar problems which resolved her painful toes.  Patient denies history of smoking Patient works at home for [employer] and has part-time retail job one day week   Review of Systems  All other systems reviewed and are negative.      Objective:   Physical Exam  Orientated 3  Vascular: No calf edema or calf tenderness bilaterally DP and PT pulses 2/4 bilaterally Capillary reflex immediate bilaterally  Neurological: Sensation to 10 g monofilament wire intact 5/5 bilaterally Vibratory sensation reactive bilaterally Ankle reflex equal and reactive bilaterally  Dermatological: Open skin lesions bilaterally Well-healed surgical scars dorsal fourth and fifth right toes Keratoses lateral fourth left toe and distal medial fifth left toe  Musculoskeletal: Stable gait Varus rotation fourth and fifth toes bilaterally There is no restriction of ankle, subtalar, or midtarsal joints bilaterally       Assessment & Plan:   Assessment: Satisfactory neurovascular status 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 Patient has interested in considering hammertoe repair and we will contact her insurance company to check benefits if she wishes to proceed she was advised to  reschedule with Dr. Earleen Newport or Dr. Amalia Hailey for surgical consultation

## 2016-05-18 ENCOUNTER — Telehealth: Payer: Self-pay | Admitting: *Deleted

## 2016-05-18 NOTE — Telephone Encounter (Signed)
"  I am being referred by Dr. Amalia Hailey to have either Dr. Jacqualyn Posey or Dr. Amalia Hailey to do my surgery.  What is the earliest days they have available?"  Dr. Jacqualyn Posey can do it on January 24 or 31.  Dr. Amalia Hailey can do it as soon as February 1.  Okay, I will make a decision and call back later to get it scheduled.  I know it will have to be after the last week of March because I have to go on a field trip with my daughter.  Can I get an estimate?  They gave me the codes."  I will transfer you to Vicki's voicemail and she can give you a call back with an estimate.  "That will be great, thank you."

## 2016-05-19 ENCOUNTER — Telehealth: Payer: Self-pay | Admitting: *Deleted

## 2016-05-19 NOTE — Telephone Encounter (Signed)
"  I spoke to you on yesterday.  I was contemplating a surgical date with Dr. Jacqualyn Posey.  You said he had January 24 available with Dr. Jacqualyn Posey.  Is that date still available?  Give me a call back."  I'm returning your call.  Your requested surgery date, January 24, is okay.  Someone from the surgical center will call you with the arrival time a day or two before surgery date.  I can't give you an arrival time because the surgical center may have cancellations, have a Diabetic patient that needs to go first or a child.  "Okay, I have an appointment for a consult already scheduled with Dr. Jacqualyn Posey.  How late does he do surgeries?"  Your surgery will be done in the morning.  "Okay, thank you."

## 2016-05-30 ENCOUNTER — Ambulatory Visit (INDEPENDENT_AMBULATORY_CARE_PROVIDER_SITE_OTHER): Payer: Managed Care, Other (non HMO) | Admitting: Podiatry

## 2016-05-30 ENCOUNTER — Ambulatory Visit (INDEPENDENT_AMBULATORY_CARE_PROVIDER_SITE_OTHER): Payer: Managed Care, Other (non HMO)

## 2016-05-30 ENCOUNTER — Encounter: Payer: Self-pay | Admitting: Podiatry

## 2016-05-30 VITALS — BP 136/93 | HR 78 | Resp 18

## 2016-05-30 DIAGNOSIS — M2042 Other hammer toe(s) (acquired), left foot: Secondary | ICD-10-CM

## 2016-05-30 NOTE — Patient Instructions (Signed)

## 2016-05-30 NOTE — Progress Notes (Signed)
Subjective: 46 year old female presents to the office today for concerns of painful hammertoes to the left 4th and 5th toes.  She states that she has been having pain for the last several years but has more recently has pain with weightbearing or pressure. She has tried padding, offloading, shoe changes without any significant improvement ans she is requesting surgical intervention.  Denies any systemic complaints such as fevers, chills, nausea, vomiting. No acute changes since last appointment, and no other complaints at this time.   Objective: AAO x3, NAD DP/PT pulses palpable bilaterally, CRT less than 3 seconds Semirigid hammertoe contractures are present in left fourth and fifth toes. Hyperkeratotic lesions present in the corresponding aspects of the IPJ of both digits. There is tenderness to both hammertoes. There is no overlying edema, erythema, increase in warmth. No open sores are present.  No open lesions or pre-ulcerative lesions.  No pain with calf compression, swelling, warmth, erythema  Assessment: Left 4th and 5th digit painful hammertoes  Plan: -All treatment options discussed with the patient including all alternatives, risks, complications.  -X-rays were obtained and reviewed with the patient. Hammertoes are present. No evidence of acute fracture.  -I discussed both conservative as well as surgical options. At this time she has attempted conservative treatment and she is requesting surgical intervention today. -Discussed PIPJ arthroplasty of the left fourth and fifth toe with K wire fixation of the fourth toe. -The incision placement as well as the postoperative course was discussed with the patient. I discussed risks of the surgery which include, but not limited to, infection, bleeding, pain, swelling, need for further surgery, delayed or nonhealing, painful or ugly scar, numbness or sensation changes, over/under correction, recurrence, transfer lesions, further deformity,  hardware failure, DVT/PE, loss of toe/foot. Patient understands these risks and wishes to proceed with surgery. The surgical consent was reviewed with the patient all 3 pages were signed. No promises or guarantees were given to the outcome of the procedure. All questions were answered to the best of my ability. Before the surgery the patient was encouraged to call the office if there is any further questions. The surgery will be performed at the [facility] on an outpatient basis.  Celesta Gentile, DPM

## 2016-06-01 ENCOUNTER — Encounter: Payer: Self-pay | Admitting: Podiatry

## 2016-06-01 DIAGNOSIS — M2042 Other hammer toe(s) (acquired), left foot: Secondary | ICD-10-CM | POA: Diagnosis not present

## 2016-06-06 ENCOUNTER — Ambulatory Visit (INDEPENDENT_AMBULATORY_CARE_PROVIDER_SITE_OTHER): Payer: Self-pay | Admitting: Podiatry

## 2016-06-06 ENCOUNTER — Encounter: Payer: Self-pay | Admitting: Podiatry

## 2016-06-06 ENCOUNTER — Ambulatory Visit: Payer: Managed Care, Other (non HMO)

## 2016-06-06 ENCOUNTER — Ambulatory Visit (INDEPENDENT_AMBULATORY_CARE_PROVIDER_SITE_OTHER): Payer: Managed Care, Other (non HMO)

## 2016-06-06 DIAGNOSIS — M2042 Other hammer toe(s) (acquired), left foot: Secondary | ICD-10-CM

## 2016-06-06 DIAGNOSIS — Z9889 Other specified postprocedural states: Secondary | ICD-10-CM

## 2016-06-06 NOTE — Progress Notes (Signed)
Subjective: [name] is a 46 y.o. is seen today in office s/p left 4th and 5th digit hammertoe repair preformed on 06/01/16. They state their pain is controlled. She has remained in the surgical shoe. Denies any systemic complaints such as fevers, chills, nausea, vomiting. No calf pain, chest pain, shortness of breath.   Objective: General: No acute distress, AAOx3  DP/PT pulses palpable 2/4, CRT < 3 sec to all digits.  Protective sensation intact. Motor function intact.  Left foot: Incision is well coapted without any evidence of dehiscence and sutures are intact and k-wire intact to the 4th digit. There is no surrounding erythema, ascending cellulitis, fluctuance, crepitus, malodor, drainage/purulence. There is minimal edema around the surgical site. There is minimal pain along the surgical site. The 5th digit is sitting slightly adducted.  No other areas of tenderness to bilateral lower extremities.  No other open lesions or pre-ulcerative lesions.  No pain with calf compression, swelling, warmth, erythema.   Assessment and Plan:  Status post left hammertoe repair, doing well with no complications   -Treatment options discussed including all alternatives, risks, and complications -X-rays were obtained and reviewed with the patient. S/p 4th and 5th digit hammertoe arthroplasty and the 5th digit is sitting adducted.  -Abx ointment is applied followed a bandage. The 5th toe was splinted in a rectus position. I did remove one stitch to help hold the toe in a rectus position.  -Continue surgical shoe.  -Ice/elevation -Pain medication as needed. -Monitor for any clinical signs or symptoms of infection and DVT/PE and directed to call the office immediately should any occur or go to the ER. -Follow-up in 1 week for possible suture removal or sooner if any problems arise. In the meantime, encouraged to call the office with any questions, concerns, change in symptoms.   Celesta Gentile, DPM

## 2016-06-06 NOTE — Progress Notes (Signed)
DOS 01.24.2018 Left 4th and 5th hammertoe repair with K-wire fixation in 4th toe.

## 2016-06-17 ENCOUNTER — Encounter: Payer: Self-pay | Admitting: Podiatry

## 2016-06-17 ENCOUNTER — Ambulatory Visit (INDEPENDENT_AMBULATORY_CARE_PROVIDER_SITE_OTHER): Payer: Self-pay | Admitting: Podiatry

## 2016-06-17 DIAGNOSIS — M2042 Other hammer toe(s) (acquired), left foot: Secondary | ICD-10-CM

## 2016-06-19 NOTE — Progress Notes (Signed)
Subjective: [name] is a 46 y.o. is seen today in office s/p left 4th and 5th digit hammertoe repair preformed on 06/01/16. She is not taking pain medication. She has remained in her surgical shoe. Denies any systemic complaints such as fevers, chills, nausea, vomiting. No calf pain, chest pain, shortness of breath.   Objective: General: No acute distress, AAOx3  DP/PT pulses palpable 2/4, CRT < 3 sec to all digits.  Protective sensation intact. Motor function intact.  Left foot: Incision is well coapted without any evidence of dehiscence and sutures are intact and k-wire intact to the 4th digit. There is no surrounding erythema, ascending cellulitis, fluctuance, crepitus, malodor, drainage/purulence. There is minimal edema around the surgical site. There is minimal pain along the surgical site. The 5th digit is sitting slightly adducted however it is improved compared to last appointment.  No other areas of tenderness to bilateral lower extremities.  No other open lesions or pre-ulcerative lesions.  No pain with calf compression, swelling, warmth, erythema.   Assessment and Plan:  Status post left hammertoe repair, doing well with no complications   -Treatment options discussed including all alternatives, risks, and complications -Sutures were removed today without complications or bleeding.  -Abx ointment is applied followed a bandage. I showed her how to tape the toe to help hold the 5th toe in a rectus position.  -Continue surgical shoe.  -Ice/elevation -Pain medication as needed. -Monitor for any clinical signs or symptoms of infection and DVT/PE and directed to call the office immediately should any occur or go to the ER. -Follow-up in 2 weeks for possible pin removal or sooner if any problems arise. In the meantime, encouraged to call the office with any questions, concerns, change in symptoms.   Celesta Gentile, DPM

## 2016-06-27 ENCOUNTER — Ambulatory Visit (INDEPENDENT_AMBULATORY_CARE_PROVIDER_SITE_OTHER): Payer: Managed Care, Other (non HMO)

## 2016-06-27 ENCOUNTER — Ambulatory Visit (INDEPENDENT_AMBULATORY_CARE_PROVIDER_SITE_OTHER): Payer: Managed Care, Other (non HMO) | Admitting: Podiatry

## 2016-06-27 DIAGNOSIS — M2042 Other hammer toe(s) (acquired), left foot: Secondary | ICD-10-CM | POA: Diagnosis not present

## 2016-06-27 DIAGNOSIS — Z9889 Other specified postprocedural states: Secondary | ICD-10-CM

## 2016-06-27 NOTE — Progress Notes (Addendum)
Subjective: [name] is a 46 y.o. is seen today in office s/p left 4th and 5th digit hammertoe repair preformed on 06/01/16. She is not taking pain medication. She has been taping her toe and it has been helping and her toe is straighter. Denies any systemic complaints such as fevers, chills, nausea, vomiting. No calf pain, chest pain, shortness of breath.   Objective: General: No acute distress, AAOx3  DP/PT pulses palpable 2/4, CRT < 3 sec to all digits.  Protective sensation intact. Motor function intact.  Left foot: Incision is well coapted without any evidence of dehiscence and sutures are intact and k-wire intact to the 4th digit. There is no surrounding erythema, ascending cellulitis, fluctuance, crepitus, malodor, drainage/purulence. There is trace edema around the surgical site. There is no pain along the surgical site. The 5th digit is sitting in a more rectus position.  No other areas of tenderness to bilateral lower extremities.  No other open lesions or pre-ulcerative lesions.  No pain with calf compression, swelling, warmth, erythema.   Assessment and Plan:  Status post left hammertoe repair, doing well with no complications   -Treatment options discussed including all alternatives, risks, and complications -X-rays were obtained and reviewed with the patient. Hardware intact to the 4th toe. The toes are in a more rectus position. No evidence of acute fracture.  -K-wire removed today without complications or bleeding. Abx ointment and bandage applied. She can start to shower tomorrow as long as the hole is closed.  -She can start to transition to a regular shoe as tolerated.  -Continue to tape the toes to help hold in a rectus position and for swelling.  -Follow-up in 6 weeks if there are any issues or to call the office sooner.   Celesta Gentile, DPM

## 2016-07-25 ENCOUNTER — Telehealth: Payer: Self-pay | Admitting: Podiatry

## 2016-07-25 NOTE — Telephone Encounter (Signed)
I called the patient to reschedule appointment to Wednesday 28 March per Dr. Jacqualyn Posey. Patient stated she still has some swelling and wants to know if this is normal. Had hammertoe surgery on 4th & 5th left 01 June 2016. Also wants to know if she should still keep the appointment. Requested a call back.

## 2016-07-25 NOTE — Telephone Encounter (Signed)
I informed pt that she would have varying degrees of swelling for 6-9 months and elevating and rest.

## 2016-08-01 ENCOUNTER — Encounter: Payer: 59 | Admitting: Podiatry

## 2016-08-03 ENCOUNTER — Encounter: Payer: 59 | Admitting: Podiatry

## 2016-08-15 ENCOUNTER — Ambulatory Visit (INDEPENDENT_AMBULATORY_CARE_PROVIDER_SITE_OTHER): Payer: 59 | Admitting: Physician Assistant

## 2016-08-15 ENCOUNTER — Encounter: Payer: Self-pay | Admitting: Physician Assistant

## 2016-08-15 VITALS — BP 124/82 | HR 87 | Temp 97.9°F | Resp 16 | Ht 68.5 in | Wt 152.4 lb

## 2016-08-15 DIAGNOSIS — M542 Cervicalgia: Secondary | ICD-10-CM | POA: Diagnosis not present

## 2016-08-15 DIAGNOSIS — R202 Paresthesia of skin: Secondary | ICD-10-CM | POA: Diagnosis not present

## 2016-08-15 MED ORDER — CYCLOBENZAPRINE HCL 10 MG PO TABS: 10.0000 mg | ORAL_TABLET | Freq: Every day | ORAL | 0 refills | Status: DC

## 2016-08-15 MED ORDER — MELOXICAM 15 MG PO TABS: ORAL_TABLET | ORAL | 1 refills | Status: DC

## 2016-08-15 NOTE — Patient Instructions (Signed)
No chewing gum Use heating pad Use flexeril at night for 3-5 nights then as needed Take mobic with food for 2 weeks then stop and take as needed Try to find a good pillow  If any worsening symptoms, if any worsening numbness, tingling, any weakness, any changes in vision, worsening headaches, etc let us know   Cervical Strain and Sprain Rehab Ask your health care provider which exercises are safe for you. Do exercises exactly as told by your health care provider and adjust them as directed. It is normal to feel mild stretching, pulling, tightness, or discomfort as you do these exercises, but you should stop right away if you feel sudden pain or your pain gets worse.Do not begin these exercises until told by your health care provider. Stretching and range of motion exercises These exercises warm up your muscles and joints and improve the movement and flexibility of your neck. These exercises also help to relieve pain, numbness, and tingling. Exercise A: Cervical side bend   1. Using good posture, sit on a stable chair or stand up. 2. Without moving your shoulders, slowly tilt your left / right ear to your shoulder until you feel a stretch in your neck muscles. You should be looking straight ahead. 3. Hold for __________ seconds. 4. Repeat with the other side of your neck. Repeat __________ times. Complete this exercise __________ times a day. Exercise B: Cervical rotation   1. Using good posture, sit on a stable chair or stand up. 2. Slowly turn your head to the side as if you are looking over your left / right shoulder.  Keep your eyes level with the ground.  Stop when you feel a stretch along the side and the back of your neck. 3. Hold for __________ seconds. 4. Repeat this by turning to your other side. Repeat __________ times. Complete this exercise __________ times a day. Exercise C: Thoracic extension and pectoral stretch  1. Roll a towel or a small blanket so it is about 4  inches (10 cm) in diameter. 2. Lie down on your back on a firm surface. 3. Put the towel lengthwise, under your spine in the middle of your back. It should not be not under your shoulder blades. The towel should line up with your spine from your middle back to your lower back. 4. Put your hands behind your head and let your elbows fall out to your sides. 5. Hold for __________ seconds. Repeat __________ times. Complete this exercise __________ times a day. Strengthening exercises These exercises build strength and endurance in your neck. Endurance is the ability to use your muscles for a long time, even after your muscles get tired. Exercise D: Upper cervical flexion, isometric  1. Lie on your back with a thin pillow behind your head and a small rolled-up towel under your neck. 2. Gently tuck your chin toward your chest and nod your head down to look toward your feet. Do not lift your head off the pillow. 3. Hold for __________ seconds. 4. Release the tension slowly. Relax your neck muscles completely before you repeat this exercise. Repeat __________ times. Complete this exercise __________ times a day. Exercise E: Cervical extension, isometric   1. Stand about 6 inches (15 cm) away from a wall, with your back facing the wall. 2. Place a soft object, about 6-8 inches (15-20 cm) in diameter, between the back of your head and the wall. A soft object could be a small pillow, a ball, or a  folded towel. 3. Gently tilt your head back and press into the soft object. Keep your jaw and forehead relaxed. 4. Hold for __________ seconds. 5. Release the tension slowly. Relax your neck muscles completely before you repeat this exercise. Repeat __________ times. Complete this exercise __________ times a day. Posture and body mechanics   Body mechanics refers to the movements and positions of your body while you do your daily activities. Posture is part of body mechanics. Good posture and healthy body  mechanics can help to relieve stress in your body's tissues and joints. Good posture means that your spine is in its natural S-curve position (your spine is neutral), your shoulders are pulled back slightly, and your head is not tipped forward. The following are general guidelines for applying improved posture and body mechanics to your everyday activities. Standing   When standing, keep your spine neutral and keep your feet about hip-width apart. Keep a slight bend in your knees. Your ears, shoulders, and hips should line up.  When you do a task in which you stand in one place for a long time, place one foot up on a stable object that is 2-4 inches (5-10 cm) high, such as a footstool. This helps keep your spine neutral. Sitting    When sitting, keep your spine neutral and your keep feet flat on the floor. Use a footrest, if necessary, and keep your thighs parallel to the floor. Avoid rounding your shoulders, and avoid tilting your head forward.  When working at a desk or a computer, keep your desk at a height where your hands are slightly lower than your elbows. Slide your chair under your desk so you are close enough to maintain good posture.  When working at a computer, place your monitor at a height where you are looking straight ahead and you do not have to tilt your head forward or downward to look at the screen. Resting  When lying down and resting, avoid positions that are most painful for you. Try to support your neck in a neutral position. You can use a contour pillow or a small rolled-up towel. Your pillow should support your neck but not push on it. This information is not intended to replace advice given to you by your health care provider. Make sure you discuss any questions you have with your health care provider. Document Released: 04/25/2005 Document Revised: 12/31/2015 Document Reviewed: 04/01/2015 Elsevier Interactive Patient Education  2017 [employer].

## 2016-08-15 NOTE — Progress Notes (Signed)
   Subjective:    Patient ID: [name], female    DOB: [birth year], 46 y.o.   MRN: [id number]  HPI 46 y.o. AAF presents with neck/head pain x months. Has been getting worse over last month, happening more frequently, multiple times a day. Right sided neck/head pain, only happens when she turns her head to the left, sharp pain last for seconds. Has had numbness bilateral hands intermittently in the past but no specific symptoms in her arms, no numbness,tingling, radiation down her arms, weakness. Will have blurry vision with reading fine print, has eye exam in Jan. Denies double vision, trouble speech, headaches/migraines. No injury, no known TMJ   Has had right leg with numbness tingling right medial lower leg all day Saturday but has not happened since that time. Patient denies lower back pain,  fever, hematuria, incontinence, weakness and saddle anesthesia   Blood pressure 124/82, pulse 87, temperature 97.9 F (36.6 C), resp. rate 16, height 5' 8.5" (1.74 m), weight 152 lb 6.4 oz (69.1 kg), SpO2 97 %.  Medications Current Outpatient Prescriptions on File Prior to Visit  Medication Sig  . levonorgestrel (MIRENA, 52 MG,) 20 MCG/24HR IUD 1 each by Intrauterine route once.   No current facility-administered medications on file prior to visit.     Problem list She has Allergy on her problem list.   Review of Systems  Constitutional: Negative.  Negative for chills, fatigue and fever.  HENT: Negative.  Negative for congestion, sore throat and trouble swallowing.   Respiratory: Negative.   Cardiovascular: Negative.   Genitourinary: Negative.   Musculoskeletal: Positive for neck pain. Negative for arthralgias, back pain, gait problem, joint swelling, myalgias and neck stiffness.  Skin: Negative.  Negative for rash.  Neurological: Positive for numbness (right leg x 1 day). Negative for dizziness, tremors, seizures, syncope, facial asymmetry, speech difficulty, weakness, light-headedness  and headaches.  Psychiatric/Behavioral: Negative for agitation and confusion.       Objective:   Physical Exam  Constitutional: She is oriented to person, place, and time. She appears well-developed and well-nourished.  HENT:  Head: Normocephalic and atraumatic.  Eyes: Conjunctivae are normal. Pupils are equal, round, and reactive to light.  Neck: Normal range of motion. Neck supple.  Cardiovascular: Normal rate and regular rhythm.   No murmur heard. Normal carotids  Pulmonary/Chest: Effort normal and breath sounds normal.  Abdominal: Soft. Bowel sounds are normal. There is no tenderness.  Musculoskeletal: Normal range of motion. She exhibits no tenderness or deformity.  normal range of motion, without spinous process tenderness, without paraspinal muscle tenderness both sides, normal sensation, reflexes, and pulses distal. Negative TMJ.   Lymphadenopathy:    She has no cervical adenopathy.  Neurological: She is alert and oriented to person, place, and time. She has normal reflexes. She displays normal reflexes. No cranial nerve deficit. She exhibits normal muscle tone. Coordination normal.  Skin: Skin is warm and dry. No rash noted.      Assessment & Plan:  1. Neck pain with abnormal Paresthesias Normal exam, at this time, will treat like muscle sprain with new pillow/mattress If not better or any new symptoms patient will call or go to the ER Will schedule follow up for CPE Flexeril/mobic, stretches given, heating pad, rest.  If not better or any new symptoms, may need imaging.    No future appointments.

## 2016-10-11 ENCOUNTER — Encounter: Payer: Self-pay | Admitting: Physician Assistant

## 2017-01-12 ENCOUNTER — Encounter: Payer: Self-pay | Admitting: Physician Assistant

## 2017-01-12 ENCOUNTER — Ambulatory Visit (INDEPENDENT_AMBULATORY_CARE_PROVIDER_SITE_OTHER): Payer: 59 | Admitting: Physician Assistant

## 2017-01-12 VITALS — BP 122/80 | HR 88 | Temp 97.5°F | Resp 14 | Ht 68.0 in | Wt 151.2 lb

## 2017-01-12 DIAGNOSIS — Z13220 Encounter for screening for lipoid disorders: Secondary | ICD-10-CM

## 2017-01-12 DIAGNOSIS — Z131 Encounter for screening for diabetes mellitus: Secondary | ICD-10-CM

## 2017-01-12 DIAGNOSIS — G4700 Insomnia, unspecified: Secondary | ICD-10-CM

## 2017-01-12 DIAGNOSIS — Z0000 Encounter for general adult medical examination without abnormal findings: Secondary | ICD-10-CM | POA: Diagnosis not present

## 2017-01-12 DIAGNOSIS — E559 Vitamin D deficiency, unspecified: Secondary | ICD-10-CM

## 2017-01-12 DIAGNOSIS — Z130 Encounter for screening for diseases of the blood and blood-forming organs and certain disorders involving the immune mechanism: Secondary | ICD-10-CM

## 2017-01-12 DIAGNOSIS — Z1389 Encounter for screening for other disorder: Secondary | ICD-10-CM

## 2017-01-12 DIAGNOSIS — T7840XD Allergy, unspecified, subsequent encounter: Secondary | ICD-10-CM

## 2017-01-12 DIAGNOSIS — Z79899 Other long term (current) drug therapy: Secondary | ICD-10-CM

## 2017-01-12 MED ORDER — TRAZODONE HCL 50 MG PO TABS: ORAL_TABLET | ORAL | 2 refills | Status: DC

## 2017-01-12 NOTE — Progress Notes (Signed)
Complete Physical  Assessment and Plan: Screening cholesterol level - Lipid panel  Screening for blood or protein in urine - Urinalysis, Routine w reflex microscopic (not at [facility]) - Microalbumin / creatinine urine ratio  Screening for diabetes mellitus - Hemoglobin A1c - Insulin, fasting  Vitamin D deficiency - VITAMIN D 25 Hydroxy (Vit-D Deficiency, Fractures)  Insomnia - will try trazodone, disucssed good habits   Screening for deficiency anemia - Iron and TIBC - Vitamin B12  Routine general medical examination at a health care facility 1 year  Discussed med's effects and SE's. Screening labs and tests as requested with regular follow-up as recommended. Over 40 minutes of exam, counseling, chart review and critical decision making was performed  HPI  This very nice 46 y.o.female presents for complete physical.  Patient has no major health issues.  Patient reports no complaints at this time.  She does not workout.  Trouble with staying asleep at night.  Her blood pressure has been controlled at home, today their BP is BP: 122/80  She does not workout. She denies chest pain, shortness of breath, dizziness.  She is not on cholesterol medication and denies myalgias. Her cholesterol is at goal. The cholesterol last visit was:   Lab Results  Component Value Date   CHOL 163 10/21/2015   HDL 83 10/21/2015   LDLCALC 64 10/21/2015   TRIG 78 10/21/2015   CHOLHDL 2.0 10/21/2015   Last A1C in the office was:  Lab Results  Component Value Date   HGBA1C 5.5 10/21/2015   Patient is on Vitamin D supplement.   Lab Results  Component Value Date   VD25OH 13 (L) 10/21/2015     BMI is Body mass index is 22.99 kg/m., she is working on diet and exercise. Wt Readings from Last 3 Encounters:  01/12/17 151 lb 3.2 oz (68.6 kg)  08/15/16 152 lb 6.4 oz (69.1 kg)  10/21/15 150 lb 9.6 oz (68.3 kg)    Current Medications:  Current Outpatient Prescriptions on File Prior to Visit   Medication Sig Dispense Refill  . cyclobenzaprine (FLEXERIL) 10 MG tablet Take 1 tablet (10 mg total) by mouth at bedtime. 30 tablet 0  . levonorgestrel (MIRENA, 52 MG,) 20 MCG/24HR IUD 1 each by Intrauterine route once.    . meloxicam (MOBIC) 15 MG tablet Take one daily with food for 2 weeks, can take with tylenol, can not take with aleve, iburpofen, then as needed daily for pain 30 tablet 1   No current facility-administered medications on file prior to visit.    Health Maintenance:   TD/TDAP: 2011 Influenza: declines Pneumovax: N/A Prevnar 13: N/A  LMP: No LMP recorded. Patient is not currently having periods (Reason: IUD). Pap: goes to GYN, Dr. Candie Mile MGM: Goes GYN  Medical History:  Past Medical History:  Diagnosis Date  . Allergy    Allergies No Known Allergies  SURGICAL HISTORY She  has a past surgical history that includes Cesarean section and Foot surgery. FAMILY HISTORY Her family history includes Cancer in her maternal aunt; Cancer (age of onset: 15) in her maternal grandmother; Diabetes in her father and paternal grandfather; Hyperlipidemia in her father; Hypertension in her father, maternal grandfather, and paternal grandmother; Kidney disease in her paternal uncle; Stroke in her maternal grandfather and paternal grandmother. SOCIAL HISTORY She  reports that she has never smoked. She has never used smokeless tobacco. She reports that she drinks alcohol. She reports that she does not use drugs.  Review of Systems: Review  of Systems  Constitutional: Negative for chills, diaphoresis, fever, malaise/fatigue and weight loss.  HENT: Negative.   Eyes: Negative.   Respiratory: Negative.   Cardiovascular: Negative.   Gastrointestinal: Negative.   Genitourinary: Negative.   Musculoskeletal: Negative.   Skin: Negative.   Neurological: Negative.  Negative for weakness.  Endo/Heme/Allergies: Negative.   Psychiatric/Behavioral: Negative for depression, hallucinations,  memory loss, substance abuse and suicidal ideas. The patient is not nervous/anxious and does not have insomnia.     Physical Exam: Estimated body mass index is 22.99 kg/m as calculated from the following:   Height as of this encounter: 5\' 8"  (1.727 m).   Weight as of this encounter: 151 lb 3.2 oz (68.6 kg). BP 122/80   Pulse 88   Temp (!) 97.5 F (36.4 C)   Resp 14   Ht 5\' 8"  (1.727 m)   Wt 151 lb 3.2 oz (68.6 kg)   SpO2 95%   BMI 22.99 kg/m  General Appearance: Well nourished, in no apparent distress.  Eyes: PERRLA, EOMs, conjunctiva no swelling or erythema, normal fundi and vessels.  Sinuses: No Frontal/maxillary tenderness  ENT/Mouth: Ext aud canals clear, normal light reflex with TMs without erythema, bulging. Good dentition. No erythema, swelling, or exudate on post pharynx. Tonsils not swollen or erythematous. Hearing normal.  Neck: Supple, thyroid normal. No bruits  Respiratory: Respiratory effort normal, BS equal bilaterally without rales, rhonchi, wheezing or stridor.  Cardio: RRR without murmurs, rubs or gallops. Brisk peripheral pulses without edema.  Chest: symmetric, with normal excursions and percussion.  Breasts: Symmetric, without lumps, nipple discharge, retractions.  Abdomen: Soft, nontender, no guarding, rebound, hernias, masses, or organomegaly.  Lymphatics: Non tender without lymphadenopathy.  Genitourinary: defer Musculoskeletal: Full ROM all peripheral extremities,5/5 strength, and normal gait.  Skin: Warm, dry without rashes, lesions, ecchymosis. Neuro: Cranial nerves intact, reflexes equal bilaterally. Normal muscle tone, no cerebellar symptoms. Sensation intact.  Psych: Awake and oriented X 3, normal affect, Insight and Judgment appropriate.   EKG: WNL  Vicie Mutters 3:25 PM [facility]

## 2017-01-12 NOTE — Patient Instructions (Addendum)
Stop fluids 2 hours before bed Drink more water during the day No wine before bed  11 Tips to Follow:  1. No caffeine after 3pm: Avoid beverages with caffeine (soda, tea, energy drinks, etc.) especially after 3pm. 2. Don't go to bed hungry: Have your evening meal at least 3 hrs. before going to sleep. It's fine to have a small bedtime snack such as a glass of milk and a few crackers but don't have a big meal. 3. Have a nightly routine before bed: Plan on "winding down" before you go to sleep. Begin relaxing about 1 hour before you go to bed. Try doing a quiet activity such as listening to calming music, reading a book or meditating. 4. Turn off the TV and ALL electronics including video games, tablets, laptops, etc. 1 hour before sleep, and keep them out of the bedroom. 5. Turn off your cell phone and all notifications (new email and text alerts) or even better, leave your phone outside your room while you sleep. Studies have shown that a part of your brain continues to respond to certain lights and sounds even while you're still asleep. 6. Make your bedroom quiet, dark and cool. If you can't control the noise, try wearing earplugs or using a fan to block out other sounds. 7. Practice relaxation techniques. Try reading a book or meditating or drain your brain by writing a list of what you need to do the next day. 8. Don't nap unless you feel sick: you'll have a better night's sleep. 9. Don't smoke, or quit if you do. Nicotine, alcohol, and marijuana can all keep you awake. Talk to your health care provider if you need help with substance use. 10. Most importantly, wake up at the same time every day (or within 1 hour of your usual wake up time) EVEN on the weekends. A regular wake up time promotes sleep hygiene and prevents sleep problems. 11. Reduce exposure to bright light in the last three hours of the day before going to sleep. Maintaining good sleep hygiene and having good sleep habits lower your  risk of developing sleep problems. Getting better sleep can also improve your concentration and alertness. Try the simple steps in this guide. If you still have trouble getting enough rest, make an appointment with your health care provider.   Vitamin D goal is between 60-80  Please make sure that you are taking your Vitamin D as directed.   It is very important as a natural anti-inflammatory   helping hair, skin, and nails, as well as reducing stroke and heart attack risk.   It helps your bones and helps with mood.  We want you on at least 5000 IU daily  It also decreases numerous cancer risks so please take it as directed.   Low Vit D is associated with a 200-300% higher risk for CANCER   and 200-300% higher risk for HEART   ATTACK  &  STROKE.    .....................................Marland Kitchen  It is also associated with higher death rate at younger ages,   autoimmune diseases like Rheumatoid arthritis, Lupus, Multiple Sclerosis.     Also many other serious conditions, like depression, Alzheimer's  Dementia, infertility, muscle aches, fatigue, fibromyalgia - just to name a few.  +++++++++++++++++++  Can get liquid vitamin D from [place] here in [place] at  [facility] alternatives [address] [place], [place] [address] Or you can try earth fare

## 2017-01-13 LAB — URINALYSIS W MICROSCOPIC + REFLEX CULTURE
BILIRUBIN URINE: NEGATIVE
Bacteria, UA: NONE SEEN /HPF
Glucose, UA: NEGATIVE
HGB URINE DIPSTICK: NEGATIVE
Hyaline Cast: NONE SEEN /LPF
KETONES UR: NEGATIVE
LEUKOCYTE ESTERASE: NEGATIVE
NITRITES URINE, INITIAL: NEGATIVE
PH: 6.5 (ref 5.0–8.0)
Protein, ur: NEGATIVE
RBC / HPF: NONE SEEN /HPF (ref 0–2)
SPECIFIC GRAVITY, URINE: 1.028 (ref 1.001–1.03)
Squamous Epithelial / HPF: NONE SEEN /HPF (ref <=5)

## 2017-01-13 LAB — BASIC METABOLIC PANEL WITH GFR
BUN: 12 mg/dL (ref 7–25)
CALCIUM: 9.7 mg/dL (ref 8.6–10.2)
CHLORIDE: 105 mmol/L (ref 98–110)
CO2: 22 mmol/L (ref 20–32)
Creat: 0.96 mg/dL (ref 0.50–1.10)
GFR, EST NON AFRICAN AMERICAN: 71 mL/min/{1.73_m2} (ref >=60)
GFR, Est African American: 83 mL/min/{1.73_m2} (ref >=60)
GLUCOSE: 78 mg/dL (ref 65–99)
POTASSIUM: 4.2 mmol/L (ref 3.5–5.3)
SODIUM: 141 mmol/L (ref 135–146)

## 2017-01-13 LAB — CBC WITH DIFFERENTIAL/PLATELET
BASOS ABS: 40 {cells}/uL (ref 0–200)
Basophils Relative: 0.9 %
Eosinophils Absolute: 40 cells/uL (ref 15–500)
Eosinophils Relative: 0.9 %
HEMATOCRIT: 41.7 % (ref 35.0–45.0)
HEMOGLOBIN: 13.7 g/dL (ref 11.7–15.5)
LYMPHS ABS: 1835 {cells}/uL (ref 850–3900)
MCH: 28.8 pg (ref 27.0–33.0)
MCHC: 32.9 g/dL (ref 32.0–36.0)
MCV: 87.8 fL (ref 80.0–100.0)
MPV: 11.3 fL (ref 7.5–12.5)
Monocytes Relative: 8.6 %
NEUTROS ABS: 2108 {cells}/uL (ref 1500–7800)
NEUTROS PCT: 47.9 %
PLATELETS: 244 10*3/uL (ref 140–400)
RBC: 4.75 10*6/uL (ref 3.80–5.10)
RDW: 13.1 % (ref 11.0–15.0)
Total Lymphocyte: 41.7 %
WBC: 4.4 10*3/uL (ref 3.8–10.8)
WBCMIX: 378 {cells}/uL (ref 200–950)

## 2017-01-13 LAB — NO CULTURE INDICATED

## 2017-01-13 LAB — HEPATIC FUNCTION PANEL
AG Ratio: 1.6 (calc) (ref 1.0–2.5)
ALBUMIN MSPROF: 4.7 g/dL (ref 3.6–5.1)
ALKALINE PHOSPHATASE (APISO): 65 U/L (ref 33–115)
ALT: 7 U/L (ref 6–29)
AST: 15 U/L (ref 10–35)
BILIRUBIN TOTAL: 0.5 mg/dL (ref 0.2–1.2)
Bilirubin, Direct: 0.1 mg/dL (ref 0.0–0.2)
Globulin: 3 g/dL (calc) (ref 1.9–3.7)
Indirect Bilirubin: 0.4 mg/dL (calc) (ref 0.2–1.2)
TOTAL PROTEIN: 7.7 g/dL (ref 6.1–8.1)

## 2017-01-13 LAB — VITAMIN B12: Vitamin B-12: 350 pg/mL (ref 200–1100)

## 2017-01-13 LAB — LIPID PANEL
Cholesterol: 178 mg/dL (ref <200)
HDL: 81 mg/dL (ref >50)
LDL Cholesterol (Calc): 79 mg/dL (calc)
NON-HDL CHOLESTEROL (CALC): 97 mg/dL (ref <130)
TRIGLYCERIDES: 99 mg/dL (ref <150)
Total CHOL/HDL Ratio: 2.2 (calc) (ref <5.0)

## 2017-01-13 LAB — IRON, TOTAL/TOTAL IRON BINDING CAP
%SAT: 22 % (calc) (ref 11–50)
IRON: 62 ug/dL (ref 40–190)
TIBC: 282 ug/dL (ref 250–450)

## 2017-01-13 LAB — MICROALBUMIN / CREATININE URINE RATIO
Creatinine, Urine: 298 mg/dL (ref 20–320)
Microalb Creat Ratio: 4 mcg/mg creat (ref <30)
Microalb, Ur: 1.1 mg/dL

## 2017-01-13 LAB — TSH: TSH: 2.85 m[IU]/L

## 2017-01-13 LAB — MAGNESIUM: MAGNESIUM: 2.2 mg/dL (ref 1.5–2.5)

## 2017-01-17 NOTE — Progress Notes (Signed)
Pt aware of lab results & voiced understanding of those results.

## 2017-02-07 ENCOUNTER — Other Ambulatory Visit: Payer: Self-pay

## 2017-02-07 MED ORDER — TRAZODONE HCL 50 MG PO TABS: ORAL_TABLET | ORAL | 0 refills | Status: DC

## 2017-10-16 ENCOUNTER — Encounter: Payer: Self-pay | Admitting: Physician Assistant

## 2018-01-18 NOTE — Progress Notes (Deleted)
Complete Physical  Assessment and Plan: Screening cholesterol level - Lipid panel  Screening for blood or protein in urine - Urinalysis, Routine w reflex microscopic (not at [facility]) - Microalbumin / creatinine urine ratio  Screening for diabetes mellitus - Hemoglobin A1c - Insulin, fasting  Vitamin D deficiency - VITAMIN D 25 Hydroxy (Vit-D Deficiency, Fractures)  Insomnia - will try trazodone, disucssed good habits   Screening for deficiency anemia - Iron and TIBC - Vitamin B12  Routine general medical examination at a health care facility 1 year  Discussed med's effects and SE's. Screening labs and tests as requested with regular follow-up as recommended. Over 40 minutes of exam, counseling, chart review and critical decision making was performed  HPI  This very nice 47 y.o.female presents for complete physical.  Patient has no major health issues.  Patient reports no complaints at this time.  She does not workout.  Trouble with staying asleep at night.  Her blood pressure has been controlled at home, today their BP is    She does not workout. She denies chest pain, shortness of breath, dizziness.  She is not on cholesterol medication and denies myalgias. Her cholesterol is at goal. The cholesterol last visit was:   Lab Results  Component Value Date   CHOL 178 01/12/2017   HDL 81 01/12/2017   LDLCALC 79 01/12/2017   TRIG 99 01/12/2017   CHOLHDL 2.2 01/12/2017   Last A1C in the office was:  Lab Results  Component Value Date   HGBA1C 5.5 10/21/2015   Patient is on Vitamin D supplement.   Lab Results  Component Value Date   VD25OH 13 (L) 10/21/2015     BMI is There is no height or weight on file to calculate BMI., she is working on diet and exercise. Wt Readings from Last 3 Encounters:  01/12/17 151 lb 3.2 oz (68.6 kg)  08/15/16 152 lb 6.4 oz (69.1 kg)  10/21/15 150 lb 9.6 oz (68.3 kg)    Current Medications:  Current Outpatient Medications on File Prior to  Visit  Medication Sig Dispense Refill  . cyclobenzaprine (FLEXERIL) 10 MG tablet Take 1 tablet (10 mg total) by mouth at bedtime. 30 tablet 0  . levonorgestrel (MIRENA, 52 MG,) 20 MCG/24HR IUD 1 each by Intrauterine route once.    . meloxicam (MOBIC) 15 MG tablet Take one daily with food for 2 weeks, can take with tylenol, can not take with aleve, iburpofen, then as needed daily for pain 30 tablet 1  . traZODone (DESYREL) 50 MG tablet 1/2-1 tablet for sleep 90 tablet 0   No current facility-administered medications on file prior to visit.    Health Maintenance:   TD/TDAP: 2011 Influenza: declines Pneumovax: N/A Prevnar 13: N/A  LMP: No LMP recorded. (Menstrual status: IUD). Pap: goes to GYN, Dr. Candie Mile MGM: Goes GYN  Medical History:  Past Medical History:  Diagnosis Date  . Allergy    Allergies No Known Allergies  SURGICAL HISTORY She  has a past surgical history that includes Cesarean section and Foot surgery. FAMILY HISTORY Her family history includes Cancer in her maternal aunt; Cancer (age of onset: 25) in her maternal grandmother; Diabetes in her father and paternal grandfather; Hyperlipidemia in her father; Hypertension in her father, maternal grandfather, and paternal grandmother; Kidney disease in her paternal uncle; Stroke in her maternal grandfather and paternal grandmother. SOCIAL HISTORY She  reports that she has never smoked. She has never used smokeless tobacco. She reports that she drinks  alcohol. She reports that she does not use drugs.  Review of Systems: Review of Systems  Constitutional: Negative for chills, diaphoresis, fever, malaise/fatigue and weight loss.  HENT: Negative.   Eyes: Negative.   Respiratory: Negative.   Cardiovascular: Negative.   Gastrointestinal: Negative.   Genitourinary: Negative.   Musculoskeletal: Negative.   Skin: Negative.   Neurological: Negative.  Negative for weakness.  Endo/Heme/Allergies: Negative.    Psychiatric/Behavioral: Negative for depression, hallucinations, memory loss, substance abuse and suicidal ideas. The patient is not nervous/anxious and does not have insomnia.     Physical Exam: Estimated body mass index is 22.99 kg/m as calculated from the following:   Height as of 01/12/17: 5\' 8"  (1.727 m).   Weight as of 01/12/17: 151 lb 3.2 oz (68.6 kg). There were no vitals taken for this visit. General Appearance: Well nourished, in no apparent distress.  Eyes: PERRLA, EOMs, conjunctiva no swelling or erythema, normal fundi and vessels.  Sinuses: No Frontal/maxillary tenderness  ENT/Mouth: Ext aud canals clear, normal light reflex with TMs without erythema, bulging. Good dentition. No erythema, swelling, or exudate on post pharynx. Tonsils not swollen or erythematous. Hearing normal.  Neck: Supple, thyroid normal. No bruits  Respiratory: Respiratory effort normal, BS equal bilaterally without rales, rhonchi, wheezing or stridor.  Cardio: RRR without murmurs, rubs or gallops. Brisk peripheral pulses without edema.  Chest: symmetric, with normal excursions and percussion.  Breasts: Symmetric, without lumps, nipple discharge, retractions.  Abdomen: Soft, nontender, no guarding, rebound, hernias, masses, or organomegaly.  Lymphatics: Non tender without lymphadenopathy.  Genitourinary: defer Musculoskeletal: Full ROM all peripheral extremities,5/5 strength, and normal gait.  Skin: Warm, dry without rashes, lesions, ecchymosis. Neuro: Cranial nerves intact, reflexes equal bilaterally. Normal muscle tone, no cerebellar symptoms. Sensation intact.  Psych: Awake and oriented X 3, normal affect, Insight and Judgment appropriate.   EKG: WNL  Vicie Mutters 2:09 PM [facility]

## 2018-01-22 ENCOUNTER — Encounter: Payer: Self-pay | Admitting: Physician Assistant

## 2018-02-28 NOTE — Progress Notes (Signed)
Complete Physical  Assessment and Plan: Screening cholesterol level - Lipid panel  Screening for blood or protein in urine - Urinalysis, Routine w reflex microscopic (not at [facility]) - Microalbumin / creatinine urine ratio  Screening for diabetes mellitus - Hemoglobin A1c - Insulin, fasting  Vitamin D deficiency - VITAMIN D 25 Hydroxy (Vit-D Deficiency, Fractures)  Insomnia - try to drink more water during the day, discucssed good habits   Screening for deficiency anemia - Iron and TIBC - Vitamin B12  Routine general medical examination at a health care facility 1 year  Discussed med's effects and SE's. Screening labs and tests as requested with regular follow-up as recommended. Over 40 minutes of exam, counseling, chart review and critical decision making was performed  HPI  This very nice 47 y.o.female presents for complete physical.  Patient has no major health issues.  Patient reports no complaints at this time.  She does not workout.  Trouble with staying asleep at night, she states she will drink too much water at night and will get up to pee.  Her blood pressure has been controlled at home, today their BP is BP: 132/88  She does not workout. She denies shortness of breath, dizziness. Chest pain 2 x in past 3 weeks, nonexertional. Dull pain, well localized LEFT upper chest, no radiation x 10 mins, no accompaniments with it.  She ran/walked 1 month ago without issues. No GERD symptoms.   She is not on cholesterol medication and denies myalgias. Her cholesterol is at goal. The cholesterol last visit was:   Lab Results  Component Value Date   CHOL 178 01/12/2017   HDL 81 01/12/2017   LDLCALC 79 01/12/2017   TRIG 99 01/12/2017   CHOLHDL 2.2 01/12/2017   Last A1C in the office was:  Lab Results  Component Value Date   HGBA1C 5.5 10/21/2015   Patient is on Vitamin D supplement, she has MVIT with 800 and will take an additional 2000 2 a day.    Lab Results  Component  Value Date   VD25OH 13 (L) 10/21/2015     BMI is Body mass index is 23.97 kg/m., she is working on diet and exercise. Wt Readings from Last 3 Encounters:  03/01/18 160 lb (72.6 kg)  01/12/17 151 lb 3.2 oz (68.6 kg)  08/15/16 152 lb 6.4 oz (69.1 kg)    Current Medications:  Current Outpatient Medications on File Prior to Visit  Medication Sig Dispense Refill  . levonorgestrel (MIRENA, 52 MG,) 20 MCG/24HR IUD 1 each by Intrauterine route once.     No current facility-administered medications on file prior to visit.    Health Maintenance:   TD/TDAP: 2011 Influenza: declines Pneumovax: N/A Prevnar 13: N/A  LMP: No LMP recorded. (Menstrual status: IUD). Pap: goes to GYN, Dr. Candie Mile MGM: Goes GYN  Medical History:  Past Medical History:  Diagnosis Date  . Allergy    Allergies No Known Allergies  SURGICAL HISTORY She  has a past surgical history that includes Cesarean section and Foot surgery. FAMILY HISTORY Her family history includes Cancer in her maternal aunt; Cancer (age of onset: 85) in her maternal grandmother; Diabetes in her father and paternal grandfather; Hyperlipidemia in her father; Hypertension in her father, maternal grandfather, and paternal grandmother; Kidney disease in her paternal uncle; Stroke in her maternal grandfather and paternal grandmother. SOCIAL HISTORY She  reports that she has never smoked. She has never used smokeless tobacco. She reports that she drinks alcohol. She reports that  she does not use drugs.  Review of Systems: Review of Systems  Constitutional: Negative for chills, diaphoresis, fever, malaise/fatigue and weight loss.  HENT: Negative.   Eyes: Negative.   Respiratory: Negative.   Cardiovascular: Negative.   Gastrointestinal: Negative.   Genitourinary: Negative.   Musculoskeletal: Negative.   Skin: Negative.   Neurological: Negative.  Negative for weakness.  Endo/Heme/Allergies: Negative.   Psychiatric/Behavioral: Negative  for depression, hallucinations, memory loss, substance abuse and suicidal ideas. The patient is not nervous/anxious and does not have insomnia.     Physical Exam: Estimated body mass index is 23.97 kg/m as calculated from the following:   Height as of this encounter: 5' 8.5" (1.74 m).   Weight as of this encounter: 160 lb (72.6 kg). BP 132/88   Pulse 77   Temp 97.9 F (36.6 C)   Resp 16   Ht 5' 8.5" (1.74 m)   Wt 160 lb (72.6 kg)   SpO2 99%   BMI 23.97 kg/m  General Appearance: Well nourished, in no apparent distress.  Eyes: PERRLA, EOMs, conjunctiva no swelling or erythema, normal fundi and vessels.  Sinuses: No Frontal/maxillary tenderness  ENT/Mouth: Ext aud canals clear, normal light reflex with TMs without erythema, bulging. Good dentition. No erythema, swelling, or exudate on post pharynx. Tonsils not swollen or erythematous. Hearing normal.  Neck: Supple, thyroid normal. No bruits  Respiratory: Respiratory effort normal, BS equal bilaterally without rales, rhonchi, wheezing or stridor.  Cardio: RRR without murmurs, rubs or gallops. Brisk peripheral pulses without edema.  Chest: symmetric, with normal excursions and percussion.  Breasts: Symmetric, without lumps, nipple discharge, retractions.  Abdomen: Soft, nontender, no guarding, rebound, hernias, masses, or organomegaly.  Lymphatics: Non tender without lymphadenopathy.  Genitourinary: defer Musculoskeletal: Full ROM all peripheral extremities,5/5 strength, and normal gait.  Skin: Warm, dry without rashes, lesions, ecchymosis. Neuro: Cranial nerves intact, reflexes equal bilaterally. Normal muscle tone, no cerebellar symptoms. Sensation intact.  Psych: Awake and oriented X 3, normal affect, Insight and Judgment appropriate.   EKG: WNL  Vicie Mutters 11:21 AM [facility]

## 2018-03-01 ENCOUNTER — Ambulatory Visit (INDEPENDENT_AMBULATORY_CARE_PROVIDER_SITE_OTHER): Payer: 59 | Admitting: Physician Assistant

## 2018-03-01 ENCOUNTER — Encounter: Payer: Self-pay | Admitting: Physician Assistant

## 2018-03-01 VITALS — BP 132/88 | HR 77 | Temp 97.9°F | Resp 16 | Ht 68.5 in | Wt 160.0 lb

## 2018-03-01 DIAGNOSIS — Z79899 Other long term (current) drug therapy: Secondary | ICD-10-CM

## 2018-03-01 DIAGNOSIS — E559 Vitamin D deficiency, unspecified: Secondary | ICD-10-CM

## 2018-03-01 DIAGNOSIS — G4700 Insomnia, unspecified: Secondary | ICD-10-CM

## 2018-03-01 DIAGNOSIS — Z0000 Encounter for general adult medical examination without abnormal findings: Secondary | ICD-10-CM

## 2018-03-01 DIAGNOSIS — Z13220 Encounter for screening for lipoid disorders: Secondary | ICD-10-CM

## 2018-03-01 DIAGNOSIS — Z23 Encounter for immunization: Secondary | ICD-10-CM

## 2018-03-01 DIAGNOSIS — Z136 Encounter for screening for cardiovascular disorders: Secondary | ICD-10-CM

## 2018-03-01 DIAGNOSIS — Z130 Encounter for screening for diseases of the blood and blood-forming organs and certain disorders involving the immune mechanism: Secondary | ICD-10-CM

## 2018-03-01 DIAGNOSIS — Z1389 Encounter for screening for other disorder: Secondary | ICD-10-CM

## 2018-03-01 DIAGNOSIS — T7840XD Allergy, unspecified, subsequent encounter: Secondary | ICD-10-CM

## 2018-03-01 DIAGNOSIS — R079 Chest pain, unspecified: Secondary | ICD-10-CM

## 2018-03-01 DIAGNOSIS — I10 Essential (primary) hypertension: Secondary | ICD-10-CM

## 2018-03-01 NOTE — Patient Instructions (Addendum)
WATER IS IMPORTANT  Being dehydrated can hurt your kidneys, cause fatigue, headaches, muscle aches, joint pain, and dry skin/nails so please increase your fluids.   Drink 80-100 oz a day of water, measure it out! Eat 3 meals a day, have to do breakfast, eat protein- hard boiled eggs, protein bar like nature valley protein bar, greek yogurt like oikos triple zero, chobani 100, or light n fit greek  Can check out plantnanny app on your phone to help you keep track of your water   VITAMIN D IS IMPORTANT  Vitamin D goal is between 60-80  Please make sure that you are taking your Vitamin D as directed.   It is very important as a natural anti-inflammatory   helping hair, skin, and nails, as well as reducing stroke and heart attack risk.   It helps your bones and helps with mood.  We want you on at least 5000 IU daily  It also decreases numerous cancer risks so please take it as directed.   Low Vit D is associated with a 200-300% higher risk for CANCER   and 200-300% higher risk for HEART   ATTACK  &  STROKE.    .....................................Marland Kitchen  It is also associated with higher death rate at younger ages,   autoimmune diseases like Rheumatoid arthritis, Lupus, Multiple Sclerosis.     Also many other serious conditions, like depression, Alzheimer's  Dementia, infertility, muscle aches, fatigue, fibromyalgia - just to name a few.  +++++++++++++++++++  Can get liquid vitamin D from [place] here in [place] at  [facility] alternatives [address], [place], [place] [address] Or you can try earth fare   Silent reflux: Not all heartburn burns...Marland KitchenMarland KitchenMarland Kitchen  What is LPR? Laryngopharyngeal reflux (LPR) or silent reflux is a condition in which acid that is made in the stomach travels up the esophagus (swallowing tube) and gets to the throat. Not everyone with reflux has a lot of heartburn or indigestion. In fact, many people with LPR never have heartburn. This is why LPR is called  SILENT REFLUX, and the terms "Silent reflux" and "LPR" are often used interchangeably. Because LPR is silent, it is sometimes difficult to diagnose.  How can you tell if you have LPR?  Marland Kitchen Chronic hoarseness- Some people have hoarseness that comes and goes . throat clearing  . Cough . It can cause shortness of breath and cause asthma like symptoms. Marland Kitchen a feeling of a lump in the throat  . difficulty swallowing . a problem with too much nose and throat drainage.  . Some people will feel their esophagus spasm which feels like their heart beating hard and fast, this will usually be after a meal, at rest, or lying down at night.    How do I treat this? Treatment for LPR should be individualized, and your doctor will suggest the best treatment for you. Generally there are several treatments for LPR: . changing habits and diet to reduce reflux,  . medications to reduce stomach acid, and  . surgery to prevent reflux. Most people with LPR need to modify how and when they eat, as well as take some medication, to get well. Sometimes, nonprescription liquid antacids, such as Maalox, Gelucil and Mylanta are recommended. When used, these antacids should be taken four times each day - one tablespoon one hour after each meal and before bedtime. Dietary and lifestyle changes alone are not often enough to control LPR - medications that reduce stomach acid are also usually needed. These  must be prescribed by our doctor.   TIPS FOR REDUCING REFLUX AND LPR Control your LIFE-STYLE and your DIET! Marland Kitchen If you use tobacco, QUIT.  Marland Kitchen Smoking makes you reflux. After every cigarette you have some LPR.  . Don't wear clothing that is too tight, especially around the waist (trousers, corsets, belts).  . Do not lie down just after eating...in fact, do not eat within three hours of bedtime.  . You should be on a low-fat diet.  . Limit your intake of red meat.  . Limit your intake of butter.  Marland Kitchen Avoid fried foods.  . Avoid  chocolate  . Avoid cheese.  Marland Kitchen Avoid eggs. Marland Kitchen Specifically avoid caffeine (especially coffee and tea), soda pop (especially cola) and mints.  . Avoid alcoholic beverages, particularly in the evening.   Costochondritis Costochondritis is swelling and irritation (inflammation) of the tissue (cartilage) that connects your ribs to your breastbone (sternum). This causes pain in the front of your chest. The pain usually starts gradually and involves more than one rib. What are the causes? The exact cause of this condition is not always known. It results from stress on the cartilage where your ribs attach to your sternum. The cause of this stress could be:  Chest injury (trauma).  Exercise or activity, such as lifting.  Severe coughing.  What increases the risk? You may be at higher risk for this condition if you:  Are female.  Are 63?46 years old.  Recently started a new exercise or work activity.  Have low levels of vitamin D.  Have a condition that makes you cough frequently.  What are the signs or symptoms? The main symptom of this condition is chest pain. The pain:  Usually starts gradually and can be sharp or dull.  Gets worse with deep breathing, coughing, or exercise.  Gets better with rest.  May be worse when you press on the sternum-rib connection (tenderness).  How is this diagnosed? This condition is diagnosed based on your symptoms, medical history, and a physical exam. Your health care provider will check for tenderness when pressing on your sternum. This is the most important finding. You may also have tests to rule out other causes of chest pain. These may include:  A chest X-ray to check for lung problems.  An electrocardiogram (ECG) to see if you have a heart problem that could be causing the pain.  An imaging scan to rule out a chest or rib fracture.  How is this treated? This condition usually goes away on its own over time. Your health care provider may  prescribe an NSAID to reduce pain and inflammation. Your health care provider may also suggest that you:  Rest and avoid activities that make pain worse.  Apply heat or cold to the area to reduce pain and inflammation.  Do exercises to stretch your chest muscles.  If these treatments do not help, your health care provider may inject a numbing medicine at the sternum-rib connection to help relieve the pain. Follow these instructions at home:  Avoid activities that make pain worse. This includes any activities that use chest, abdominal, and side muscles.  If directed, put ice on the painful area: ? Put ice in a plastic bag. ? Place a towel between your skin and the bag. ? Leave the ice on for 20 minutes, 2-3 times a day.  If directed, apply heat to the affected area as often as told by your health care provider. Use the heat source  that your health care provider recommends, such as a moist heat pack or a heating pad. ? Place a towel between your skin and the heat source. ? Leave the heat on for 20-30 minutes. ? Remove the heat if your skin turns bright red. This is especially important if you are unable to feel pain, heat, or cold. You may have a greater risk of getting burned.  Take over-the-counter and prescription medicines only as told by your health care provider.  Return to your normal activities as told by your health care provider. Ask your health care provider what activities are safe for you.  Keep all follow-up visits as told by your health care provider. This is important. Contact a health care provider if:  You have chills or a fever.  Your pain does not go away or it gets worse.  You have a cough that does not go away (is persistent). Get help right away if:  You have shortness of breath. This information is not intended to replace advice given to you by your health care provider. Make sure you discuss any questions you have with your health care provider. Document  Released: 02/02/2005 Document Revised: 11/13/2015 Document Reviewed: 08/19/2015 Elsevier Interactive Patient Education  [employer].

## 2018-03-02 LAB — URINALYSIS, ROUTINE W REFLEX MICROSCOPIC
Bilirubin Urine: NEGATIVE
Glucose, UA: NEGATIVE
Hgb urine dipstick: NEGATIVE
Ketones, ur: NEGATIVE
LEUKOCYTES UA: NEGATIVE
Nitrite: NEGATIVE
PROTEIN: NEGATIVE
SPECIFIC GRAVITY, URINE: 1.014 (ref 1.001–1.03)
pH: 7 (ref 5.0–8.0)

## 2018-03-02 LAB — COMPLETE METABOLIC PANEL WITH GFR
AG RATIO: 1.6 (calc) (ref 1.0–2.5)
ALBUMIN MSPROF: 4.4 g/dL (ref 3.6–5.1)
ALKALINE PHOSPHATASE (APISO): 64 U/L (ref 33–115)
ALT: 7 U/L (ref 6–29)
AST: 13 U/L (ref 10–35)
BUN: 12 mg/dL (ref 7–25)
CHLORIDE: 105 mmol/L (ref 98–110)
CO2: 30 mmol/L (ref 20–32)
Calcium: 10.1 mg/dL (ref 8.6–10.2)
Creat: 0.95 mg/dL (ref 0.50–1.10)
GFR, Est African American: 83 mL/min/{1.73_m2} (ref >=60)
GFR, Est Non African American: 72 mL/min/{1.73_m2} (ref >=60)
GLUCOSE: 88 mg/dL (ref 65–99)
Globulin: 2.7 g/dL (calc) (ref 1.9–3.7)
Potassium: 4.5 mmol/L (ref 3.5–5.3)
Sodium: 141 mmol/L (ref 135–146)
Total Bilirubin: 0.6 mg/dL (ref 0.2–1.2)
Total Protein: 7.1 g/dL (ref 6.1–8.1)

## 2018-03-02 LAB — LIPID PANEL
CHOLESTEROL: 193 mg/dL (ref <200)
HDL: 69 mg/dL (ref >50)
LDL CHOLESTEROL (CALC): 100 mg/dL — AB
Non-HDL Cholesterol (Calc): 124 mg/dL (calc) (ref <130)
Total CHOL/HDL Ratio: 2.8 (calc) (ref <5.0)
Triglycerides: 138 mg/dL (ref <150)

## 2018-03-02 LAB — MICROALBUMIN / CREATININE URINE RATIO
CREATININE, URINE: 89 mg/dL (ref 20–275)
Microalb Creat Ratio: 7 mcg/mg creat (ref <30)
Microalb, Ur: 0.6 mg/dL

## 2018-03-02 LAB — CBC WITH DIFFERENTIAL/PLATELET
BASOS ABS: 21 {cells}/uL (ref 0–200)
Basophils Relative: 0.5 %
EOS PCT: 1.9 %
Eosinophils Absolute: 80 cells/uL (ref 15–500)
HCT: 39.5 % (ref 35.0–45.0)
Hemoglobin: 13.3 g/dL (ref 11.7–15.5)
Lymphs Abs: 1651 cells/uL (ref 850–3900)
MCH: 29.4 pg (ref 27.0–33.0)
MCHC: 33.7 g/dL (ref 32.0–36.0)
MCV: 87.2 fL (ref 80.0–100.0)
MPV: 11.2 fL (ref 7.5–12.5)
Monocytes Relative: 7.6 %
NEUTROS PCT: 50.7 %
Neutro Abs: 2129 cells/uL (ref 1500–7800)
PLATELETS: 231 10*3/uL (ref 140–400)
RBC: 4.53 10*6/uL (ref 3.80–5.10)
RDW: 13 % (ref 11.0–15.0)
TOTAL LYMPHOCYTE: 39.3 %
WBC: 4.2 10*3/uL (ref 3.8–10.8)
WBCMIX: 319 {cells}/uL (ref 200–950)

## 2018-03-02 LAB — MAGNESIUM: Magnesium: 2 mg/dL (ref 1.5–2.5)

## 2018-03-02 LAB — VITAMIN B12: VITAMIN B 12: 539 pg/mL (ref 200–1100)

## 2018-03-02 LAB — VITAMIN D 25 HYDROXY (VIT D DEFICIENCY, FRACTURES): Vit D, 25-Hydroxy: 81 ng/mL (ref 30–100)

## 2018-03-02 LAB — IRON, TOTAL/TOTAL IRON BINDING CAP
%SAT: 40 % (calc) (ref 16–45)
IRON: 120 ug/dL (ref 40–190)
TIBC: 302 mcg/dL (calc) (ref 250–450)

## 2018-03-02 LAB — TSH: TSH: 1.06 mIU/L

## 2018-06-18 ENCOUNTER — Encounter: Payer: Self-pay | Admitting: Adult Health

## 2018-06-18 ENCOUNTER — Ambulatory Visit (INDEPENDENT_AMBULATORY_CARE_PROVIDER_SITE_OTHER): Payer: 59 | Admitting: Adult Health

## 2018-06-18 VITALS — BP 130/90 | HR 78 | Temp 97.7°F | Ht 68.5 in | Wt 166.0 lb

## 2018-06-18 DIAGNOSIS — R202 Paresthesia of skin: Secondary | ICD-10-CM | POA: Diagnosis not present

## 2018-06-18 NOTE — Progress Notes (Signed)
Assessment and Plan:  Korbyn was seen today for tingling.  Diagnoses and all orders for this visit:  Paresthesia of bilateral legs Unclear etiology but improving with increased activity and ibuprofen Neg back pain or straight leg raises Avoid tight clothing, she will try increasing activity, get up and walk hourly, take ibuprofen regularly for 1-2 weeks, she declines presciption prednisone or NSAID today She will follow up if any new symptoms or if symptoms are coming back Can get imaging, consider lab workup or refer to neuro at that time    Further disposition pending results of labs. Discussed med's effects and SE's.   Over 15 minutes of exam, counseling, chart review, and critical decision making was performed.   Future Appointments  Date Time Provider [place]  06/18/2018  3:30 PM [name], NP [facility] None  03/14/2019 10:00 AM Vicie Mutters, PA-C [facility] None    ------------------------------------------------------------------------------------------------------------------   HPI There were no vitals taken for this visit.  48 y.o.female with benign medical history presents for evaluation of bilateral lower extremity parasthesias; she is unsure of when it exactly started, but noted that symptoms were notably worse as of 4 days. She reports she was at work and noticed that bilateral lower extremities overall felt "like muscles stretching" and also "tingling" but she is unsure/unclear about how to describe. She reports this continued all day. She reports sensation stops when moving, once she starts ambulating.   She reports she has been under a lot of stress  She denies hx of back injuries, no back pain other than typical stiffness   Denies fever/chills, rashes, myalgias, cramps  She has tried ibuprofen, which she reports that this seemed to help  Overall she feels symptoms are improving   Past Medical History:  Diagnosis Date  . Allergy       No Known Allergies  Current Outpatient Medications on File Prior to Visit  Medication Sig  . levonorgestrel (MIRENA, 52 MG,) 20 MCG/24HR IUD 1 each by Intrauterine route once.   No current facility-administered medications on file prior to visit.     ROS: all negative except above.   Physical Exam:  There were no vitals taken for this visit.  General Appearance: Well nourished, in no apparent distress. Eyes: conjunctiva no swelling or erythema Neck: Supple, thyroid normal.  Respiratory: Respiratory effort normal Cardio: RRR with no MRGs. Brisk peripheral pulses without edema.  Abdomen: Soft, + BS.  Non tender, no guarding, rebound, hernias, masses. Lymphatics: Non tender without lymphadenopathy.  Musculoskeletal: Full ROM, 5/5 strength, normal gait.  Straight leg raising with dorsiflexion negative bilaterally for radicular symptoms. Sensory exam in the legs are normal. Knee reflexes are normal Ankle reflexes are normal Strength is normal and symmetric in arms and legs. There is not SI tenderness to palpation.  There is not paraspinal muscle spasm.  There is not midline tenderness.  ROM of spine with  No limitations.  Skin: Warm, dry without rashes, lesions, ecchymosis.  Neuro: Cranial nerves intact. Normal muscle tone, no cerebellar symptoms. Sensation intact. Reflexes symmetrical and intact Psych: Awake and oriented X 3, normal affect, Insight and Judgment appropriate.     Izora Ribas, NP 1:09 PM [facility]

## 2018-06-18 NOTE — Patient Instructions (Signed)
Try ibuprofen or aleve regularly for the 1-2 weeks, then as needed  Call back if not resolving      Pinched Nerve A pinched nerve is an injury that occurs when too much pressure is placed on a nerve. This pressure can cause pain, burning, and muscle weakness in places that the nerve supplies feeling to, such as an arm, hand, or leg, or the back or neck. If a nerve is severely pinched or has been pinched for a long time, permanent nerve damage can occur. What are the causes? This condition may be caused by:  A nerve passing through a narrow area between bones or other body structures.  Arthritis that causes bones to press on a nerve.  Loss of blood supply to a nerve.  A nerve being stretched from an injury.  A sudden injury with swelling.  Long-term wear on the nerve.  Age-related changes in the spine. What are the signs or symptoms? The most common symptoms of a pinched nerve are feeling a tingling sensation and numbness. Other symptoms include:  Pain that spreads from one area of the body part to another.  A burning feeling.  Muscle weakness. How is this diagnosed? This condition may be diagnosed based on:  A physical exam. During the exam, your health care provider will: ? Check for numbness and muscle weakness. ? Move affected body parts to test for pain.  X-rays to check for bone damage.  A MRI or CT scan to check for conditions that may be causing nerve damage.  A muscle test (electromyogram, EMG) to evaluate how your muscles and nerves communicate. How is this treated?     A pinched nerve is usually treated first by:  Resting the affected body area.  Using devices to help you move without pain (supportive or protective devices), such as a splint, brace, or neck collar. Other treatments depend on your symptoms and the amount of nerve damage you have. Other treatments may include:  Medicines, such as: ? Injections of numbing medicine. ? NSAIDs. ? Pain  medicines. ? Steroid medicines. These may be given as a pill or as an injection.  Physical therapy to relieve pain, maintain movement, and improve muscle strength.  Surgery. This may be done if other treatments do not work. Follow these instructions at home:      Take over-the-counter and prescription medicines only as told by your health care provider.  Wear supportive or protective devices as told by your health care provider.  Do physical therapy exercises as directed.  Ask your health care provider what activities are safe for you.  Rest as needed.  If directed, put ice on the affected area: ? Put ice in a plastic bag. ? Place a towel between your skin and the bag. ? Leave the ice on for 20 minutes, 2-3 times a day.  If directed, apply heat to the affected area. Use the heat source that your health care provider recommends, such as a moist heat pack or a heating pad. ? Place a towel between your skin and the heat source. ? Leave the heat on for 20-30 minutes. ? Remove the heat if your skin turns bright red. This is especially important if you are unable to feel pain, heat, or cold. You may have a greater risk of getting burned.  Do not drive or use heavy machinery while taking prescription pain medicine.  Keep all follow-up visits as told by your health care provider. This is important. Contact a  health care provider if:  Your condition does not improve with treatment.  Your pain, numbness, or weakness suddenly gets worse. Get help right away if you:  Have loss of bladder control (urinary incontinence) or you cannot urinate.  Cannot control bowel movements (fecal incontinence).  Have new weakness in your arms or legs. Summary  A pinched nerve is an injury that occurs when too much pressure is placed on a nerve.  This pressure can cause pain, burning, and muscle weakness in places that the nerve supplies feeling to, such as an arm, hand, or leg, or the back or  neck.  Take over-the-counter and prescription medicines only as told by your health care provider.  Ask your health care provider what activities are safe for you while you are having symptoms. This information is not intended to replace advice given to you by your health care provider. Make sure you discuss any questions you have with your health care provider. Document Released: 04/15/2002 Document Revised: 05/09/2017 Document Reviewed: 05/09/2017 Elsevier Interactive Patient Education  2019 [employer].

## 2019-01-23 ENCOUNTER — Encounter: Payer: Self-pay | Admitting: Physician Assistant

## 2019-03-12 NOTE — Progress Notes (Signed)
Complete Physical  Assessment and Plan: Screening cholesterol level - Lipid panel  Screening for blood or protein in urine - Urinalysis, Routine w reflex microscopic (not at [facility]) - Microalbumin / creatinine urine ratio  Screening for diabetes mellitus - Hemoglobin A1c  Vitamin D deficiency - VITAMIN D 25 Hydroxy (Vit-D Deficiency, Fractures)  Insomnia - try to drink more water during the day, discucssed good habits - IF NOT BETTER MAY CONSIDER SLEEP STUDY   Screening for deficiency anemia - Iron and TIBC  Routine general medical examination at a health care facility 1 year  Paresthesia of bilateral legs -     Anti-Smith antibody -     Anti-DNA antibody, double-stranded -     ANA -     RNP Antibody - with muscle aches, costochondritis, sex, age, will rule out lupus  Screening for cardiovascular condition -     EKG [id number]  Folliculitis -     nystatin cream (MYCOSTATIN); Apply 1 application topically 2 (two) times daily. -     triamcinolone cream (KENALOG) 0.5 %; Apply 1 application topically 2 (two) times daily.    Discussed med's effects and SE's. Screening labs and tests as requested with regular follow-up as recommended. Over 40 minutes of exam, counseling, chart review and critical decision making was performed  HPI  This very nice 48 y.o.female presents for complete physical.  Patient has no major health issues.  Patient reports no complaints at this time.  She does not workout.   She has had intermittent left arm pit pain that radiates to her left breast, last 10 -15 seconds, sharp pain. Nothing made it worse, nothing better. No accompaniments.  She has had costochondritis, paraesthsias, myalgias with her thumbs and hands hurting, no family history of lupus but due to several complaints , age and race will rule out lupus.  She saw her GYN, she is post menopausal, on estrogen 0.5mg  daily and has the progesterone IUD.   Her blood pressure has been controlled  at home, today their BP is BP: 124/76  She does not workout. She denies shortness of breath, dizziness.   She is not on cholesterol medication and denies myalgias. Her cholesterol is at goal. The cholesterol last visit was:   Lab Results  Component Value Date   CHOL 193 03/01/2018   HDL 69 03/01/2018   LDLCALC 100 (H) 03/01/2018   TRIG 138 03/01/2018   CHOLHDL 2.8 03/01/2018   Last A1C in the office was:  Lab Results  Component Value Date   HGBA1C 5.5 10/21/2015   Patient is on Vitamin D supplement, she has MVIT with 800 and will take an additional 2000 2 a day.    Lab Results  Component Value Date   VD25OH 81 03/01/2018     BMI is Body mass index is 25.84 kg/m., she is working on diet and exercise. Wt Readings from Last 3 Encounters:  03/14/19 165 lb (74.8 kg)  06/18/18 166 lb (75.3 kg)  03/01/18 160 lb (72.6 kg)    Current Medications:  Current Outpatient Medications on File Prior to Visit  Medication Sig Dispense Refill  . Cholecalciferol (VITAMIN D3) 125 MCG (5000 UT) CAPS Take by mouth daily.    Marland Kitchen levonorgestrel (MIRENA, 52 MG,) 20 MCG/24HR IUD 1 each by Intrauterine route once.    . Multiple Vitamin (MULTIVITAMIN) tablet Take 1 tablet by mouth daily.     No current facility-administered medications on file prior to visit.     Immunization History  Administered Date(s) Administered  . Tdap 03/01/2018    Health Maintenance:   TD/TDAP: 2019 Influenza: declines Pneumovax: N/A Prevnar 13: N/A  LMP: No LMP recorded. (Menstrual status: IUD). Pap: goes to GYN, Dr. Candie Mile- sees Donia Pounds: Goes GYN- 04/2018  Medical History:  Past Medical History:  Diagnosis Date  . Allergy    Allergies No Known Allergies  SURGICAL HISTORY She  has a past surgical history that includes Cesarean section and Foot surgery. FAMILY HISTORY Her family history includes Cancer in her maternal aunt; Cancer (age of onset: 53) in her maternal grandmother; Diabetes in her father  and paternal grandfather; Hyperlipidemia in her father; Hypertension in her father, maternal grandfather, and paternal grandmother; Kidney disease in her paternal uncle; Stroke in her maternal grandfather. SOCIAL HISTORY She  reports that she has never smoked. She has never used smokeless tobacco. She reports current alcohol use. She reports that she does not use drugs.  Review of Systems: Review of Systems  Constitutional: Negative for chills, diaphoresis, fever, malaise/fatigue and weight loss.  HENT: Negative.   Eyes: Negative.   Respiratory: Negative.   Cardiovascular: Negative.   Gastrointestinal: Negative.   Genitourinary: Positive for frequency.  Musculoskeletal: Positive for joint pain and myalgias.  Skin: Negative.   Neurological: Negative.  Negative for weakness.  Endo/Heme/Allergies: Negative.   Psychiatric/Behavioral: Negative for depression, hallucinations, memory loss, substance abuse and suicidal ideas. The patient has insomnia. The patient is not nervous/anxious.     Physical Exam: Estimated body mass index is 25.84 kg/m as calculated from the following:   Height as of this encounter: 5\' 7"  (1.702 m).   Weight as of this encounter: 165 lb (74.8 kg). BP 124/76   Pulse 72   Temp 97.9 F (36.6 C)   Ht 5\' 7"  (1.702 m)   Wt 165 lb (74.8 kg)   SpO2 98%   BMI 25.84 kg/m  General Appearance: Well nourished, in no apparent distress.  Eyes: PERRLA, EOMs, conjunctiva no swelling or erythema, normal fundi and vessels.  Sinuses: No Frontal/maxillary tenderness  ENT/Mouth: Ext aud canals clear, normal light reflex with TMs without erythema, bulging. Good dentition. No erythema, swelling, or exudate on post pharynx. Tonsils not swollen or erythematous. Hearing normal.  Neck: Supple, thyroid normal. No bruits  Respiratory: Respiratory effort normal, BS equal bilaterally without rales, rhonchi, wheezing or stridor.  Cardio: RRR without murmurs, rubs or gallops. Brisk peripheral  pulses without edema.  Chest: symmetric, with normal excursions and percussion.  Breasts: Symmetric, without lumps, nipple discharge, retractions.  Abdomen: Soft, nontender, no guarding, rebound, hernias, masses, or organomegaly.  Lymphatics: Non tender without lymphadenopathy.  Genitourinary: defer Musculoskeletal: Full ROM all peripheral extremities,5/5 strength, and normal gait.  Skin: on left buttocks near cleft, 5 white nodules at follicle, no discharge, no warmth. Warm, dry without rashes, lesions, ecchymosis. Neuro: Cranial nerves intact, reflexes equal bilaterally. Normal muscle tone, no cerebellar symptoms. Sensation intact.  Psych: Awake and oriented X 3, normal affect, Insight and Judgment appropriate.   EKG: WNL  Vicie Mutters 10:16 AM [facility]

## 2019-03-14 ENCOUNTER — Ambulatory Visit (INDEPENDENT_AMBULATORY_CARE_PROVIDER_SITE_OTHER): Payer: 59 | Admitting: Physician Assistant

## 2019-03-14 ENCOUNTER — Encounter: Payer: Self-pay | Admitting: Physician Assistant

## 2019-03-14 ENCOUNTER — Other Ambulatory Visit: Payer: Self-pay

## 2019-03-14 VITALS — BP 124/76 | HR 72 | Temp 97.9°F | Ht 67.0 in | Wt 165.0 lb

## 2019-03-14 DIAGNOSIS — Z0000 Encounter for general adult medical examination without abnormal findings: Secondary | ICD-10-CM | POA: Diagnosis not present

## 2019-03-14 DIAGNOSIS — T7840XD Allergy, unspecified, subsequent encounter: Secondary | ICD-10-CM

## 2019-03-14 DIAGNOSIS — L739 Follicular disorder, unspecified: Secondary | ICD-10-CM

## 2019-03-14 DIAGNOSIS — Z13220 Encounter for screening for lipoid disorders: Secondary | ICD-10-CM

## 2019-03-14 DIAGNOSIS — Z136 Encounter for screening for cardiovascular disorders: Secondary | ICD-10-CM | POA: Diagnosis not present

## 2019-03-14 DIAGNOSIS — Z23 Encounter for immunization: Secondary | ICD-10-CM | POA: Diagnosis not present

## 2019-03-14 DIAGNOSIS — E559 Vitamin D deficiency, unspecified: Secondary | ICD-10-CM

## 2019-03-14 DIAGNOSIS — G4700 Insomnia, unspecified: Secondary | ICD-10-CM

## 2019-03-14 DIAGNOSIS — R202 Paresthesia of skin: Secondary | ICD-10-CM

## 2019-03-14 DIAGNOSIS — Z79899 Other long term (current) drug therapy: Secondary | ICD-10-CM

## 2019-03-14 DIAGNOSIS — I10 Essential (primary) hypertension: Secondary | ICD-10-CM | POA: Diagnosis not present

## 2019-03-14 DIAGNOSIS — Z130 Encounter for screening for diseases of the blood and blood-forming organs and certain disorders involving the immune mechanism: Secondary | ICD-10-CM

## 2019-03-14 DIAGNOSIS — Z131 Encounter for screening for diabetes mellitus: Secondary | ICD-10-CM

## 2019-03-14 DIAGNOSIS — Z1389 Encounter for screening for other disorder: Secondary | ICD-10-CM

## 2019-03-14 MED ORDER — NYSTATIN 100000 UNIT/GM EX CREA: 1.0000 "application " | TOPICAL_CREAM | Freq: Two times a day (BID) | CUTANEOUS | 1 refills | Status: DC

## 2019-03-14 MED ORDER — TRIAMCINOLONE ACETONIDE 0.5 % EX CREA: 1.0000 "application " | TOPICAL_CREAM | Freq: Two times a day (BID) | CUTANEOUS | 2 refills | Status: DC

## 2019-03-14 NOTE — Addendum Note (Signed)
Addended by: Elsie Amis D on: 03/14/2019 12:22 PM   Modules accepted: Orders

## 2019-03-14 NOTE — Patient Instructions (Addendum)
Get the mammogram  Can take 100mg  trazodone    11 Tips to Follow:  1. No caffeine after 3pm: Avoid beverages with caffeine (soda, tea, energy drinks, etc.) especially after 3pm. 2. Don't go to bed hungry: Have your evening meal at least 3 hrs. before going to sleep. It's fine to have a small bedtime snack such as a glass of milk and a few crackers but don't have a big meal. 3. Have a nightly routine before bed: Plan on "winding down" before you go to sleep. Begin relaxing about 1 hour before you go to bed. Try doing a quiet activity such as listening to calming music, reading a book or meditating. 4. Turn off the TV and ALL electronics including video games, tablets, laptops, etc. 1 hour before sleep, and keep them out of the bedroom. 5. Turn off your cell phone and all notifications (new email and text alerts) or even better, leave your phone outside your room while you sleep. Studies have shown that a part of your brain continues to respond to certain lights and sounds even while you're still asleep. 6. Make your bedroom quiet, dark and cool. If you can't control the noise, try wearing earplugs or using a fan to block out other sounds. 7. Practice relaxation techniques. Try reading a book or meditating or drain your brain by writing a list of what you need to do the next day. 8. Don't nap unless you feel sick: you'll have a better night's sleep. 9. Don't smoke, or quit if you do. Nicotine, alcohol, and marijuana can all keep you awake. Talk to your health care provider if you need help with substance use. 10. Most importantly, wake up at the same time every day (or within 1 hour of your usual wake up time) EVEN on the weekends. A regular wake up time promotes sleep hygiene and prevents sleep problems. 11. Reduce exposure to bright light in the last three hours of the day before going to sleep. Maintaining good sleep hygiene and having good sleep habits lower your risk of developing sleep  problems. Getting better sleep can also improve your concentration and alertness. Try the simple steps in this guide. If you still have trouble getting enough rest, make an appointment with your health care provider.   Costochondritis  Costochondritis is swelling and irritation (inflammation) of the tissue (cartilage) that connects your ribs to your breastbone (sternum). This causes pain in the front of your chest. The pain usually starts gradually and involves more than one rib. What are the causes? The exact cause of this condition is not always known. It results from stress on the cartilage where your ribs attach to your sternum. The cause of this stress could be:  Chest injury (trauma).  Exercise or activity, such as lifting.  Severe coughing. What increases the risk? You may be at higher risk for this condition if you:  Are female.  Are 20?47 years old.  Recently started a new exercise or work activity.  Have low levels of vitamin D.  Have a condition that makes you cough frequently. What are the signs or symptoms? The main symptom of this condition is chest pain. The pain:  Usually starts gradually and can be sharp or dull.  Gets worse with deep breathing, coughing, or exercise.  Gets better with rest.  May be worse when you press on the sternum-rib connection (tenderness). How is this diagnosed? This condition is diagnosed based on your symptoms, medical history, and a physical  exam. Your health care provider will check for tenderness when pressing on your sternum. This is the most important finding. You may also have tests to rule out other causes of chest pain. These may include:  A chest X-ray to check for lung problems.  An electrocardiogram (ECG) to see if you have a heart problem that could be causing the pain.  An imaging scan to rule out a chest or rib fracture. How is this treated? This condition usually goes away on its own over time. Your health care  provider may prescribe an NSAID to reduce pain and inflammation. Your health care provider may also suggest that you:  Rest and avoid activities that make pain worse.  Apply heat or cold to the area to reduce pain and inflammation.  Do exercises to stretch your chest muscles. If these treatments do not help, your health care provider may inject a numbing medicine at the sternum-rib connection to help relieve the pain. Follow these instructions at home:  Avoid activities that make pain worse. This includes any activities that use chest, abdominal, and side muscles.  If directed, put ice on the painful area: ? Put ice in a plastic bag. ? Place a towel between your skin and the bag. ? Leave the ice on for 20 minutes, 2-3 times a day.  If directed, apply heat to the affected area as often as told by your health care provider. Use the heat source that your health care provider recommends, such as a moist heat pack or a heating pad. ? Place a towel between your skin and the heat source. ? Leave the heat on for 20-30 minutes. ? Remove the heat if your skin turns bright red. This is especially important if you are unable to feel pain, heat, or cold. You may have a greater risk of getting burned.  Take over-the-counter and prescription medicines only as told by your health care provider.  Return to your normal activities as told by your health care provider. Ask your health care provider what activities are safe for you.  Keep all follow-up visits as told by your health care provider. This is important. Contact a health care provider if:  You have chills or a fever.  Your pain does not go away or it gets worse.  You have a cough that does not go away (is persistent). Get help right away if:  You have shortness of breath. This information is not intended to replace advice given to you by your health care provider. Make sure you discuss any questions you have with your health care provider.  Document Released: 02/02/2005 Document Revised: 05/10/2017 Document Reviewed: 08/19/2015 Elsevier Patient Education  2020 [employer].

## 2019-03-16 LAB — COMPLETE METABOLIC PANEL WITH GFR
AG Ratio: 2 (calc) (ref 1.0–2.5)
ALT: 8 U/L (ref 6–29)
AST: 15 U/L (ref 10–35)
Albumin: 4.9 g/dL (ref 3.6–5.1)
Alkaline phosphatase (APISO): 81 U/L (ref 31–125)
BUN: 11 mg/dL (ref 7–25)
CO2: 28 mmol/L (ref 20–32)
Calcium: 10.1 mg/dL (ref 8.6–10.2)
Chloride: 105 mmol/L (ref 98–110)
Creat: 1.02 mg/dL (ref 0.50–1.10)
GFR, Est African American: 76 mL/min/{1.73_m2} (ref >=60)
GFR, Est Non African American: 65 mL/min/{1.73_m2} (ref >=60)
Globulin: 2.5 g/dL (calc) (ref 1.9–3.7)
Glucose, Bld: 86 mg/dL (ref 65–99)
Potassium: 4.3 mmol/L (ref 3.5–5.3)
Sodium: 143 mmol/L (ref 135–146)
Total Bilirubin: 0.6 mg/dL (ref 0.2–1.2)
Total Protein: 7.4 g/dL (ref 6.1–8.1)

## 2019-03-16 LAB — HEMOGLOBIN A1C
Hgb A1c MFr Bld: 5.3 % of total Hgb (ref <5.7)
Mean Plasma Glucose: 105 (calc)
eAG (mmol/L): 5.8 (calc)

## 2019-03-16 LAB — URINALYSIS, ROUTINE W REFLEX MICROSCOPIC
Bilirubin Urine: NEGATIVE
Glucose, UA: NEGATIVE
Hgb urine dipstick: NEGATIVE
Ketones, ur: NEGATIVE
Leukocytes,Ua: NEGATIVE
Nitrite: NEGATIVE
Protein, ur: NEGATIVE
Specific Gravity, Urine: 1.025 (ref 1.001–1.03)
pH: <=5 (ref 5.0–8.0)

## 2019-03-16 LAB — CBC WITH DIFFERENTIAL/PLATELET
Absolute Monocytes: 288 cells/uL (ref 200–950)
Basophils Absolute: 30 cells/uL (ref 0–200)
Basophils Relative: 0.7 %
Eosinophils Absolute: 30 cells/uL (ref 15–500)
Eosinophils Relative: 0.7 %
HCT: 42.6 % (ref 35.0–45.0)
Hemoglobin: 14 g/dL (ref 11.7–15.5)
Lymphs Abs: 1742 cells/uL (ref 850–3900)
MCH: 29.1 pg (ref 27.0–33.0)
MCHC: 32.9 g/dL (ref 32.0–36.0)
MCV: 88.6 fL (ref 80.0–100.0)
MPV: 11.5 fL (ref 7.5–12.5)
Monocytes Relative: 6.7 %
Neutro Abs: 2210 cells/uL (ref 1500–7800)
Neutrophils Relative %: 51.4 %
Platelets: 229 10*3/uL (ref 140–400)
RBC: 4.81 10*6/uL (ref 3.80–5.10)
RDW: 12.9 % (ref 11.0–15.0)
Total Lymphocyte: 40.5 %
WBC: 4.3 10*3/uL (ref 3.8–10.8)

## 2019-03-16 LAB — TSH: TSH: 2.08 mIU/L

## 2019-03-16 LAB — LIPID PANEL
Cholesterol: 205 mg/dL — ABNORMAL HIGH (ref <200)
HDL: 71 mg/dL (ref >=50)
LDL Cholesterol (Calc): 108 mg/dL (calc) — ABNORMAL HIGH
Non-HDL Cholesterol (Calc): 134 mg/dL (calc) — ABNORMAL HIGH (ref <130)
Total CHOL/HDL Ratio: 2.9 (calc) (ref <5.0)
Triglycerides: 148 mg/dL (ref <150)

## 2019-03-16 LAB — IRON, TOTAL/TOTAL IRON BINDING CAP
%SAT: 41 % (calc) (ref 16–45)
Iron: 117 ug/dL (ref 40–190)
TIBC: 283 mcg/dL (calc) (ref 250–450)

## 2019-03-16 LAB — RNP ANTIBODY: Ribonucleic Protein(ENA) Antibody, IgG: <1 AI

## 2019-03-16 LAB — ANTI-DNA ANTIBODY, DOUBLE-STRANDED: ds DNA Ab: 3 IU/mL

## 2019-03-16 LAB — VITAMIN B12: Vitamin B-12: 525 pg/mL (ref 200–1100)

## 2019-03-16 LAB — VITAMIN D 25 HYDROXY (VIT D DEFICIENCY, FRACTURES): Vit D, 25-Hydroxy: 88 ng/mL (ref 30–100)

## 2019-03-16 LAB — MICROALBUMIN / CREATININE URINE RATIO
Creatinine, Urine: 269 mg/dL (ref 20–275)
Microalb Creat Ratio: 11 mcg/mg creat (ref <30)
Microalb, Ur: 2.9 mg/dL

## 2019-03-16 LAB — MAGNESIUM: Magnesium: 2.2 mg/dL (ref 1.5–2.5)

## 2019-03-16 LAB — FERRITIN: Ferritin: 102 ng/mL (ref 16–232)

## 2019-03-16 LAB — ANA: Anti Nuclear Antibody (ANA): NEGATIVE

## 2019-03-16 LAB — ANTI-SMITH ANTIBODY: ENA SM Ab Ser-aCnc: <1 AI

## 2019-05-23 LAB — HM MAMMOGRAPHY

## 2020-03-13 DIAGNOSIS — Z6824 Body mass index (BMI) 24.0-24.9, adult: Secondary | ICD-10-CM | POA: Insufficient documentation

## 2020-03-13 DIAGNOSIS — E785 Hyperlipidemia, unspecified: Secondary | ICD-10-CM | POA: Insufficient documentation

## 2020-03-13 DIAGNOSIS — E663 Overweight: Secondary | ICD-10-CM | POA: Insufficient documentation

## 2020-03-13 DIAGNOSIS — Z6825 Body mass index (BMI) 25.0-25.9, adult: Secondary | ICD-10-CM | POA: Insufficient documentation

## 2020-03-13 DIAGNOSIS — E559 Vitamin D deficiency, unspecified: Secondary | ICD-10-CM | POA: Insufficient documentation

## 2020-03-13 NOTE — Progress Notes (Signed)
Complete Physical  Assessment and Plan:  Routine general medical examination at a health care facility 1 year Follows with GYN annually   Hypertension New onset, was initiated on HCTZ by GYN with reported improvement Discussed persistent diastolic elevation; she admits to excess sodium intake, will reduce intake and start monitoring at home Monitor blood pressure at home; call if consistently over 130/80 Continue DASH diet.   Reminder to go to the ER if any CP, SOB, nausea, dizziness, severe HA, changes vision/speech, left arm numbness and tingling and jaw pain. - CBC -CMP/GFR - Magnesium - TSH - Urinalysis - EKG  Mild hyperlipidemia Continue low cholesterol diet and exercise.  Check lipid panel.  - Lipid panel - TSH  Insomnia - good sleep hygiene discussed, increase day time activity - some benefit with trazodone but patient prefers to avoid meds, monitor for now, consider belsomra due to difficulty staying asleep   Screening for blood or protein in urine - Urinalysis, Routine w reflex microscopic (not at [facility])  Screening for diabetes mellitus - Hemoglobin A1c  Vitamin D deficiency Continue supplement - VITAMIN D 25 Hydroxy (Vit-D Deficiency, Fractures)  Screening for cardiovascular condition -     EKG 12-Lead  Need for influenza vaccine Quadrivalent flu vaccine administered without complication today   Orders Placed This Encounter  Procedures  . FLU VACCINE MDCK QUAD W/Preservative  . CBC with Differential/Platelet  . COMPLETE METABOLIC PANEL WITH GFR  . Magnesium  . Lipid panel  . TSH  . Hemoglobin A1c  . VITAMIN D 25 Hydroxy (Vit-D Deficiency, Fractures)  . Urinalysis, Routine w reflex microscopic  . EKG 12-Lead     Discussed med's effects and SE's. Screening labs and tests as requested with regular follow-up as recommended. Over 40 minutes of exam, counseling, chart review and critical decision making was performed  Future Appointments  Date Time  Provider [employer]  09/15/2020  9:30 AM [name], NP [facility] None  03/17/2021  9:00 AM [name], NP [facility] None     HPI  This very nice 49 y.o.female presents for complete physical. She has Allergy; Mild hyperlipidemia; BMI 25.0-25.9,adult; Vitamin D deficiency; and Hypertension on their problem list.  She has long term partner, has 57 y/o child together.   Patient reports no complaints at this time. Just back from [place] cruise. Got covid 19 test this AM.    She [facility]YN[facility], Jamie, NP, she is post menopausal, has progesterone IUD. She was started on estrogen but had BP elevations, has been d/c'd. Was having some dyspareunia, but reports has been managing well with coconut oil. Declines STD check today.   She has intermittent trouble staying asleep, has tried trazodone which helped some, manages ok, improves when less stressed, doesn't want to take meds.   BMI is Body mass index is 24.33 kg/m., she has been working on diet and exercise, walking with neighbor 2-2.5 miles 3-4 days week.  Minimal red meat Trying to get in 4 bottles of water. May have 1 soda per day instead of coffee.  Wt Readings from Last 3 Encounters:  03/16/20 162 lb 6.4 oz (73.7 kg)  03/14/19 165 lb (74.8 kg)  06/18/18 166 lb (75.3 kg)   She has BP cuff but hasn't been checking, today their BP is BP: 124/90, was started on HCTZ, unsure of dose, does admit sodium intake may be problematic  She does not workout. She denies shortness of breath, dizziness.   She is not on  cholesterol medication and denies myalgias. Her cholesterol is not at goal. The cholesterol last visit was:   Lab Results  Component Value Date   CHOL 205 (H) 03/14/2019   HDL 71 03/14/2019   LDLCALC 108 (H) 03/14/2019   TRIG 148 03/14/2019   CHOLHDL 2.9 03/14/2019   Last A1C in the office was:  Lab Results  Component Value Date   HGBA1C 5.3 03/14/2019   Last GFR:  Lab Results  Component  Value Date   GFRAA 76 03/14/2019   Patient is on Vitamin D supplement, she has MVIT with 800 and will take an additional 2000 2 a day.    Lab Results  Component Value Date   VD25OH 88 03/14/2019        Current Medications:  Current Outpatient Medications on File Prior to Visit  Medication Sig Dispense Refill  . Cholecalciferol (VITAMIN D3) 125 MCG (5000 UT) CAPS Take by mouth daily.    . hydrochlorothiazide (HYDRODIURIL) 12.5 MG tablet Take 12.5 mg by mouth daily.    Marland Kitchen levonorgestrel (MIRENA, 52 MG,) 20 MCG/24HR IUD 1 each by Intrauterine route once.    . Multiple Vitamin (MULTIVITAMIN) tablet Take 1 tablet by mouth daily.     No current facility-administered medications on file prior to visit.    Immunization History  Administered Date(s) Administered  . Influenza Inj Mdck Quad With Preservative 03/14/2019, 03/16/2020  . PFIZER SARS-COV-2 Vaccination 09/07/2019, 09/28/2019  . Tdap 03/01/2018    Health Maintenance:   TD/TDAP: 2019 Influenza: 03/2019 TODAY Pneumovax: N/A Prevnar 13: N/A Covid 19: 2/2, pfizer 09/2019  LMP: No LMP recorded. (Menstrual status: IUD). Pap: goes to GYN annually, Dr. Candie Mile- sees Roselyn Reef, never abnormal  MGM: Goes GYN- had last year 2020 at [place] [facility] exam: Mauri Reading, last 10/2019, wears glasses Last dental exam: last 2021  Medical History:  Past Medical History:  Diagnosis Date  . Allergy    Allergies No Known Allergies  SURGICAL HISTORY She  has a past surgical history that includes Cesarean section and Foot surgery. FAMILY HISTORY Her family history includes Cancer (age of onset: 1) in her maternal grandmother; Dementia in her paternal grandmother; Diabetes in her father and paternal grandfather; Hyperlipidemia in her father; Hypertension in her father, maternal grandfather, and paternal grandmother; Kidney disease in her maternal uncle; Lymphoma in her maternal aunt; Stroke in her maternal grandfather. SOCIAL HISTORY She   reports that she has never smoked. She has never used smokeless tobacco. She reports current alcohol use. She reports that she does not use drugs.  Review of Systems: Review of Systems  Constitutional: Negative for malaise/fatigue and weight loss.  HENT: Negative for hearing loss and tinnitus.   Eyes: Negative for blurred vision and double vision.  Respiratory: Negative for cough, shortness of breath and wheezing.   Cardiovascular: Negative for chest pain, palpitations, orthopnea, claudication and leg swelling.  Gastrointestinal: Negative for abdominal pain, blood in stool, constipation (has 3-4 BM per week, daily if increased veggie intake), diarrhea, heartburn, melena, nausea and vomiting.  Genitourinary: Negative.   Musculoskeletal: Negative for joint pain and myalgias.  Skin: Negative for rash.  Neurological: Negative for dizziness, tingling, sensory change, weakness and headaches.  Endo/Heme/Allergies: Negative for polydipsia.  Psychiatric/Behavioral: Negative.   All other systems reviewed and are negative.   Physical Exam: Estimated body mass index is 24.33 kg/m as calculated from the following:   Height as of this encounter: 5' 8.5" (1.74 m).   Weight as of this  encounter: 162 lb 6.4 oz (73.7 kg). BP 124/90   Pulse 75   Temp 97.7 F (36.5 C)   Ht 5' 8.5" (1.74 m)   Wt 162 lb 6.4 oz (73.7 kg)   SpO2 96%   BMI 24.33 kg/m  General Appearance: Well nourished, well dressed AA female in no apparent distress.  Eyes: PERRLA, EOMs, conjunctiva no swelling or erythema Sinuses: No Frontal/maxillary tenderness  ENT/Mouth: Ext aud canals clear, normal light reflex with TMs without erythema, bulging. Good dentition. No erythema, swelling, or exudate on post pharynx. Tonsils not swollen or erythematous. Hearing normal.  Neck: Supple, thyroid normal. No bruits  Respiratory: Respiratory effort normal, BS equal bilaterally without rales, rhonchi, wheezing or stridor.  Cardio: RRR without  murmurs, rubs or gallops. Brisk peripheral pulses without edema.  Chest: symmetric, with normal excursions and percussion.  Breasts: Defer to GYN Abdomen: Soft, nontender, no guarding, rebound, hernias, masses, or organomegaly.  Lymphatics: Non tender without lymphadenopathy.  Genitourinary: defer Musculoskeletal: Full ROM all peripheral extremities,5/5 strength, and normal gait.  Skin:  Warm, dry without rashes, lesions, ecchymosis. Neuro: Cranial nerves intact, reflexes equal bilaterally. Normal muscle tone, no cerebellar symptoms. Sensation intact.  Psych: Awake and oriented X 3, normal affect, Insight and Judgment appropriate.   EKG: Sinus rhythm, WNL  Gorden Harms Christel Bai 12:28 PM [facility]

## 2020-03-16 ENCOUNTER — Other Ambulatory Visit: Payer: Self-pay

## 2020-03-16 ENCOUNTER — Encounter: Payer: Self-pay | Admitting: Adult Health

## 2020-03-16 ENCOUNTER — Ambulatory Visit (INDEPENDENT_AMBULATORY_CARE_PROVIDER_SITE_OTHER): Payer: No Typology Code available for payment source | Admitting: Adult Health

## 2020-03-16 VITALS — BP 124/90 | HR 75 | Temp 97.7°F | Ht 68.5 in | Wt 162.4 lb

## 2020-03-16 DIAGNOSIS — I10 Essential (primary) hypertension: Secondary | ICD-10-CM

## 2020-03-16 DIAGNOSIS — Z1329 Encounter for screening for other suspected endocrine disorder: Secondary | ICD-10-CM

## 2020-03-16 DIAGNOSIS — Z0000 Encounter for general adult medical examination without abnormal findings: Secondary | ICD-10-CM | POA: Diagnosis not present

## 2020-03-16 DIAGNOSIS — Z6824 Body mass index (BMI) 24.0-24.9, adult: Secondary | ICD-10-CM

## 2020-03-16 DIAGNOSIS — Z136 Encounter for screening for cardiovascular disorders: Secondary | ICD-10-CM

## 2020-03-16 DIAGNOSIS — E785 Hyperlipidemia, unspecified: Secondary | ICD-10-CM

## 2020-03-16 DIAGNOSIS — Z6825 Body mass index (BMI) 25.0-25.9, adult: Secondary | ICD-10-CM

## 2020-03-16 DIAGNOSIS — T7840XD Allergy, unspecified, subsequent encounter: Secondary | ICD-10-CM

## 2020-03-16 DIAGNOSIS — Z131 Encounter for screening for diabetes mellitus: Secondary | ICD-10-CM

## 2020-03-16 DIAGNOSIS — Z23 Encounter for immunization: Secondary | ICD-10-CM

## 2020-03-16 DIAGNOSIS — E559 Vitamin D deficiency, unspecified: Secondary | ICD-10-CM

## 2020-03-16 NOTE — Patient Instructions (Addendum)
[name] , Thank you for taking time to come for your Annual Wellness Visit. I appreciate your ongoing commitment to your health goals. Please review the following plan we discussed and let me know if I can assist you in the future.   These are the goals we discussed: Goals    . Blood Pressure < 130/80       This is a list of the screening recommended for you and due dates:  Health Maintenance  Topic Date Due  .  Hepatitis C: One time screening is recommended by Center for Disease Control  (CDC) for  adults born from [birth year] through 1964.   Never done  . Pap Smear  11/02/2017  . Tetanus Vaccine  03/01/2028  . COVID-19 Vaccine  Completed  . HIV Screening  Completed  . Flu Shot  Discontinued    Increase beans/whole grains, chia seeds, fruit/veggies for fiber Or could add fiber supplement    Try really watching/reducing sodium to help improve bottom blood pressure number  Can try switching to "no salt" substitute     Know what a healthy weight is for you (roughly BMI <25) and aim to maintain this  Aim for 7+ servings of fruits and vegetables daily  65-80+ fluid ounces of water or unsweet tea for healthy kidneys  Limit to max 1 drink of alcohol per day; avoid smoking/tobacco  Limit animal fats in diet for cholesterol and heart health - choose grass fed whenever available  Avoid highly processed foods, and foods high in saturated/trans fats  Aim for low stress - take time to unwind and care for your mental health  Aim for 150 min of moderate intensity exercise weekly for heart health, and weights twice weekly for bone health  Aim for 7-9 hours of sleep daily     HYPERTENSION INFORMATION  Monitor your blood pressure at home, please keep a record and bring that in with you to your next office visit.   Go to the ER if any CP, SOB, nausea, dizziness, severe HA, changes vision/speech  Testing/Procedures: HOW TO TAKE YOUR BLOOD PRESSURE:  Rest 5 minutes before taking  your blood pressure.  Don't smoke or drink caffeinated beverages for at least 30 minutes before.  Take your blood pressure before (not after) you eat.  Sit comfortably with your back supported and both feet on the floor (don't cross your legs).  Elevate your arm to heart level on a table or a desk.  Use the proper sized cuff. It should fit smoothly and snugly around your bare upper arm. There should be enough room to slip a fingertip under the cuff. The bottom edge of the cuff should be 1 inch above the crease of the elbow.  Your most recent BP: BP: 124/90   Take your medications faithfully as instructed. Maintain a healthy weight. Get at least 150 minutes of aerobic exercise per week. Minimize salt intake. Minimize alcohol intake  DASH Eating Plan DASH stands for "Dietary Approaches to Stop Hypertension." The DASH eating plan is a healthy eating plan that has been shown to reduce high blood pressure (hypertension). Additional health benefits may include reducing the risk of type 2 diabetes mellitus, heart disease, and stroke. The DASH eating plan may also help with weight loss. WHAT DO I NEED TO KNOW ABOUT THE DASH EATING PLAN? For the DASH eating plan, you will follow these general guidelines:  Choose foods with a percent daily value for sodium of less than 5% (as listed  on the food label).  Use salt-free seasonings or herbs instead of table salt or sea salt.  Check with your health care provider or pharmacist before using salt substitutes.  Eat lower-sodium products, often labeled as "lower sodium" or "no salt added."  Eat fresh foods.  Eat more vegetables, fruits, and low-fat dairy products.  Choose whole grains. Look for the word "whole" as the first word in the ingredient list.  Choose fish and skinless chicken or [place] more often than red meat. Limit fish, poultry, and meat to 6 oz (170 g) each day.  Limit sweets, desserts, sugars, and sugary drinks.  Choose  heart-healthy fats.  Limit cheese to 1 oz (28 g) per day.  Eat more home-cooked food and less restaurant, buffet, and fast food.  Limit fried foods.  Cook foods using methods other than frying.  Limit canned vegetables. If you do use them, rinse them well to decrease the sodium.  When eating at a restaurant, ask that your food be prepared with less salt, or no salt if possible. WHAT FOODS CAN I EAT? Seek help from a dietitian for individual calorie needs. Grains Whole grain or whole wheat bread. Brown rice. Whole grain or whole wheat pasta. Quinoa, bulgur, and whole grain cereals. Low-sodium cereals. Corn or whole wheat flour tortillas. Whole grain cornbread. Whole grain crackers. Low-sodium crackers. Vegetables Fresh or frozen vegetables (raw, steamed, roasted, or grilled). Low-sodium or reduced-sodium tomato and vegetable juices. Low-sodium or reduced-sodium tomato sauce and paste. Low-sodium or reduced-sodium canned vegetables.  Fruits All fresh, canned (in natural juice), or frozen fruits. Meat and Other Protein Products Ground beef (85% or leaner), grass-fed beef, or beef trimmed of fat. Skinless chicken or [place]. Ground chicken or [place]. Pork trimmed of fat. All fish and seafood. Eggs. Dried beans, peas, or lentils. Unsalted nuts and seeds. Unsalted canned beans. Dairy Low-fat dairy products, such as skim or 1% milk, 2% or reduced-fat cheeses, low-fat ricotta or cottage cheese, or plain low-fat yogurt. Low-sodium or reduced-sodium cheeses. Fats and Oils Tub margarines without trans fats. Light or reduced-fat mayonnaise and salad dressings (reduced sodium). Avocado. Safflower, olive, or canola oils. Natural peanut or almond butter. Other Unsalted popcorn and pretzels. The items listed above may not be a complete list of recommended foods or beverages. Contact your dietitian for more options. WHAT FOODS ARE NOT RECOMMENDED? Grains White bread. White pasta. White rice. Refined  cornbread. Bagels and croissants. Crackers that contain trans fat. Vegetables Creamed or fried vegetables. Vegetables in a cheese sauce. Regular canned vegetables. Regular canned tomato sauce and paste. Regular tomato and vegetable juices. Fruits Dried fruits. Canned fruit in light or heavy syrup. Fruit juice. Meat and Other Protein Products Fatty cuts of meat. Ribs, chicken wings, bacon, sausage, bologna, salami, chitterlings, fatback, hot dogs, bratwurst, and packaged luncheon meats. Salted nuts and seeds. Canned beans with salt. Dairy Whole or 2% milk, cream, half-and-half, and cream cheese. Whole-fat or sweetened yogurt. Full-fat cheeses or blue cheese. Nondairy creamers and whipped toppings. Processed cheese, cheese spreads, or cheese curds. Condiments Onion and garlic salt, seasoned salt, table salt, and sea salt. Canned and packaged gravies. Worcestershire sauce. Tartar sauce. Barbecue sauce. Teriyaki sauce. Soy sauce, including reduced sodium. Steak sauce. Fish sauce. Oyster sauce. Cocktail sauce. Horseradish. Ketchup and mustard. Meat flavorings and tenderizers. Bouillon cubes. Hot sauce. Tabasco sauce. Marinades. Taco seasonings. Relishes. Fats and Oils Butter, stick margarine, lard, shortening, ghee, and bacon fat. Coconut, palm kernel, or palm oils. Regular salad dressings. Other Angie Fava  and olives. Salted popcorn and pretzels. The items listed above may not be a complete list of foods and beverages to avoid. Contact your dietitian for more information. WHERE CAN I FIND MORE INFORMATION? National Heart, Lung, and Blood Institute: travelstabloid.com Document Released: 04/14/2011 Document Revised: 09/09/2013 Document Reviewed: 02/27/2013 [facility] Patient Information 2015 [place], [place]. This information is not intended to replace advice given to you by your health care provider. Make sure you discuss any questions you have with your health care  provider.

## 2020-03-17 LAB — CBC WITH DIFFERENTIAL/PLATELET
Absolute Monocytes: 269 {cells}/uL (ref 200–950)
Basophils Absolute: 29 {cells}/uL (ref 0–200)
Basophils Relative: 0.7 %
Eosinophils Absolute: 50 {cells}/uL (ref 15–500)
Eosinophils Relative: 1.2 %
HCT: 40.6 % (ref 35.0–45.0)
Hemoglobin: 13.6 g/dL (ref 11.7–15.5)
Lymphs Abs: 1621 {cells}/uL (ref 850–3900)
MCH: 29.8 pg (ref 27.0–33.0)
MCHC: 33.5 g/dL (ref 32.0–36.0)
MCV: 89 fL (ref 80.0–100.0)
MPV: 11 fL (ref 7.5–12.5)
Monocytes Relative: 6.4 %
Neutro Abs: 2230 {cells}/uL (ref 1500–7800)
Neutrophils Relative %: 53.1 %
Platelets: 221 10*3/uL (ref 140–400)
RBC: 4.56 Million/uL (ref 3.80–5.10)
RDW: 13 % (ref 11.0–15.0)
Total Lymphocyte: 38.6 %
WBC: 4.2 10*3/uL (ref 3.8–10.8)

## 2020-03-17 LAB — COMPLETE METABOLIC PANEL WITH GFR
AG Ratio: 1.7 (calc) (ref 1.0–2.5)
ALT: 6 U/L (ref 6–29)
AST: 13 U/L (ref 10–35)
Albumin: 4.6 g/dL (ref 3.6–5.1)
Alkaline phosphatase (APISO): 60 U/L (ref 31–125)
BUN: 13 mg/dL (ref 7–25)
CO2: 34 mmol/L — ABNORMAL HIGH (ref 20–32)
Calcium: 10.5 mg/dL — ABNORMAL HIGH (ref 8.6–10.2)
Chloride: 103 mmol/L (ref 98–110)
Creat: 1.05 mg/dL (ref 0.50–1.10)
GFR, Est African American: 73 mL/min/{1.73_m2} (ref >=60)
GFR, Est Non African American: 63 mL/min/{1.73_m2} (ref >=60)
Globulin: 2.7 g/dL (calc) (ref 1.9–3.7)
Glucose, Bld: 91 mg/dL (ref 65–99)
Potassium: 4.4 mmol/L (ref 3.5–5.3)
Sodium: 143 mmol/L (ref 135–146)
Total Bilirubin: 0.5 mg/dL (ref 0.2–1.2)
Total Protein: 7.3 g/dL (ref 6.1–8.1)

## 2020-03-17 LAB — VITAMIN D 25 HYDROXY (VIT D DEFICIENCY, FRACTURES): Vit D, 25-Hydroxy: 63 ng/mL (ref 30–100)

## 2020-03-17 LAB — HEMOGLOBIN A1C
Hgb A1c MFr Bld: 5.6 % of total Hgb (ref <5.7)
Mean Plasma Glucose: 114 (calc)
eAG (mmol/L): 6.3 (calc)

## 2020-03-17 LAB — LIPID PANEL
Cholesterol: 202 mg/dL — ABNORMAL HIGH (ref <200)
HDL: 72 mg/dL (ref >=50)
LDL Cholesterol (Calc): 105 mg/dL (calc) — ABNORMAL HIGH
Non-HDL Cholesterol (Calc): 130 mg/dL (calc) — ABNORMAL HIGH (ref <130)
Total CHOL/HDL Ratio: 2.8 (calc) (ref <5.0)
Triglycerides: 140 mg/dL (ref <150)

## 2020-03-17 LAB — URINALYSIS, ROUTINE W REFLEX MICROSCOPIC
Bilirubin Urine: NEGATIVE
Glucose, UA: NEGATIVE
Hgb urine dipstick: NEGATIVE
Ketones, ur: NEGATIVE
Leukocytes,Ua: NEGATIVE
Nitrite: NEGATIVE
Protein, ur: NEGATIVE
Specific Gravity, Urine: 1.008 (ref 1.001–1.03)
pH: 6.5 (ref 5.0–8.0)

## 2020-03-17 LAB — MAGNESIUM: Magnesium: 2.3 mg/dL (ref 1.5–2.5)

## 2020-03-17 LAB — TSH: TSH: 1.61 mIU/L

## 2020-03-25 ENCOUNTER — Encounter: Payer: Self-pay | Admitting: Internal Medicine

## 2020-09-11 ENCOUNTER — Telehealth: Payer: Self-pay

## 2020-09-11 NOTE — Telephone Encounter (Signed)
Patient states that she is having a pain on the back left side of her head that comes and goes. Started this morning. Patient states that she is not having any blurry vision, nausea, numbness or tingling. Provider aware and advised patient to try Tylenol and warm compress on the back of the neck. Advised also, if not better or gets worse, needs to go the ED.

## 2020-09-14 NOTE — Progress Notes (Deleted)
FOLLOW UP  Assessment and Plan:   Hypertension Well controlled with current medications  Monitor blood pressure at home; patient to call if consistently greater than 130/80 Continue DASH diet.   Reminder to go to the ER if any CP, SOB, nausea, dizziness, severe HA, changes vision/speech, left arm numbness and tingling and jaw pain. - Check CMP/GFR, magnesium  Cholesterol Currently with mild elevations managed by lifestyle  Continue low cholesterol diet and exercise.  Check lipid panel, TSH   BMI 24 Long discussion about weight loss, diet, and exercise Recommended diet heavy in fruits and veggies and low in animal meats, cheeses, and dairy products, appropriate calorie intake Discussed ideal weight for height (below ***) and initial weight goal (***) Patient will work on *** Will follow up in 3 months  Vitamin D Def At goal at last visit; continue supplementation to maintain goal of 60-100 Defer Vit D level  Continue diet and meds as discussed. Further disposition pending results of labs. Discussed med's effects and SE's.   Over 30 minutes of exam, counseling, chart review, and critical decision making was performed.   Future Appointments  Date Time Provider [employer]  09/15/2020  9:30 AM [name], NP [facility] None  03/17/2021  9:00 AM [name], NP [facility] None    ----------------------------------------------------------------------------------------------------------------------  HPI 50 y.o. female  presents for 6 month follow up on hypertension, cholesterol and vitamin D deficiency.   BMI is There is no height or weight on file to calculate BMI., she {HAS HAS BTD:17616} been working on diet and exercise. Wt Readings from Last 3 Encounters:  03/16/20 162 lb 6.4 oz (73.7 kg)  03/14/19 165 lb (74.8 kg)  06/18/18 166 lb (75.3 kg)   Her blood pressure {HAS HAS NOT:18834} been controlled at home, today their BP is    She {DOES_DOES WVP:71062}  workout. She denies chest pain, shortness of breath, dizziness.   She {ACTION; IS/IS IRS:85462703} on cholesterol medication {Cholesterol meds:21887} and denies myalgias. Her cholesterol {ACTION; IS/IS NOT:21021397} at goal. The cholesterol last visit was:   Lab Results  Component Value Date   CHOL 202 (H) 03/16/2020   HDL 72 03/16/2020   LDLCALC 105 (H) 03/16/2020   TRIG 140 03/16/2020   CHOLHDL 2.8 03/16/2020    She {Has/has not:18111} been working on diet and exercise for glucose managment, and denies {Symptoms; diabetes w/o none:19199}. Last A1C in the office was:  Lab Results  Component Value Date   HGBA1C 5.6 03/16/2020   Patient is on Vitamin D supplement.   Lab Results  Component Value Date   VD25OH 63 03/16/2020        Current Medications:  Current Outpatient Medications on File Prior to Visit  Medication Sig  . Cholecalciferol (VITAMIN D3) 125 MCG (5000 UT) CAPS Take by mouth daily.  . hydrochlorothiazide (HYDRODIURIL) 12.5 MG tablet Take 12.5 mg by mouth daily.  Marland Kitchen levonorgestrel (MIRENA, 52 MG,) 20 MCG/24HR IUD 1 each by Intrauterine route once.  . Multiple Vitamin (MULTIVITAMIN) tablet Take 1 tablet by mouth daily.   No current facility-administered medications on file prior to visit.     Allergies: No Known Allergies   Medical History:  Past Medical History:  Diagnosis Date  . Allergy    Family history- Reviewed and unchanged Social history- Reviewed and unchanged   Review of Systems:  ROS    Physical Exam: There were no vitals taken for this visit. Wt Readings from Last 3 Encounters:  03/16/20 162 lb 6.4 oz (  73.7 kg)  03/14/19 165 lb (74.8 kg)  06/18/18 166 lb (75.3 kg)   General Appearance: Well nourished, in no apparent distress. Eyes: PERRLA, EOMs, conjunctiva no swelling or erythema Sinuses: No Frontal/maxillary tenderness ENT/Mouth: Ext aud canals clear, TMs without erythema, bulging. No erythema, swelling, or exudate on post pharynx.   Tonsils not swollen or erythematous. Hearing normal.  Neck: Supple, thyroid normal.  Respiratory: Respiratory effort normal, BS equal bilaterally without rales, rhonchi, wheezing or stridor.  Cardio: RRR with no MRGs. Brisk peripheral pulses without edema.  Abdomen: Soft, + BS.  Non tender, no guarding, rebound, hernias, masses. Lymphatics: Non tender without lymphadenopathy.  Musculoskeletal: Full ROM, 5/5 strength, Normal gait Skin: Warm, dry without rashes, lesions, ecchymosis.  Neuro: Cranial nerves intact. No cerebellar symptoms.  Psych: Awake and oriented X 3, normal affect, Insight and Judgment appropriate.    Autumn Ribas, NP 7:54 AM [facility]

## 2020-09-15 ENCOUNTER — Ambulatory Visit: Payer: No Typology Code available for payment source | Admitting: Adult Health

## 2020-11-03 ENCOUNTER — Other Ambulatory Visit: Payer: Self-pay

## 2020-11-03 ENCOUNTER — Encounter: Payer: Self-pay | Admitting: Adult Health

## 2020-11-03 ENCOUNTER — Ambulatory Visit (INDEPENDENT_AMBULATORY_CARE_PROVIDER_SITE_OTHER): Payer: No Typology Code available for payment source | Admitting: Adult Health

## 2020-11-03 VITALS — BP 124/78 | HR 88 | Temp 96.8°F | Wt 171.0 lb

## 2020-11-03 DIAGNOSIS — E559 Vitamin D deficiency, unspecified: Secondary | ICD-10-CM | POA: Diagnosis not present

## 2020-11-03 DIAGNOSIS — Z6824 Body mass index (BMI) 24.0-24.9, adult: Secondary | ICD-10-CM

## 2020-11-03 DIAGNOSIS — E785 Hyperlipidemia, unspecified: Secondary | ICD-10-CM | POA: Diagnosis not present

## 2020-11-03 DIAGNOSIS — I10 Essential (primary) hypertension: Secondary | ICD-10-CM | POA: Diagnosis not present

## 2020-11-03 NOTE — Progress Notes (Signed)
FOLLOW UP  Assessment and Plan:   Hypertension Initially elevated; improved on recheck Monitor blood pressure at home; patient to call if consistently greater than 130/80 Continue DASH diet.   Reminder to go to the ER if any CP, SOB, nausea, dizziness, severe HA, changes vision/speech, left arm numbness and tingling and jaw pain.  Cholesterol Currently with mild elevations managed by lifestyle Continue low cholesterol diet and exercise.  Check lipid panel.   Overweight Long discussion about weight loss, diet, and exercise Recommended diet heavy in fruits and veggies and low in animal meats, cheeses, and dairy products, appropriate calorie intake Discussed ideal weight for height  Patient will work on reducing eating out, fast food spurges Keep food log to help track  Will follow up in 3 months  Vitamin D Def At goal at last visit; continue supplementation to maintain goal of 60-100 Defer Vit D level  Continue diet and meds as discussed. Further disposition pending results of labs. Discussed med's effects and SE's.   Over 30 minutes of exam, counseling, chart review, and critical decision making was performed.   Future Appointments  Date Time Provider [place]  03/17/2021  9:00 AM [name], NP [facility] None    ----------------------------------------------------------------------------------------------------------------------  HPI 50 y.o. female  presents for 6 month follow up on hypertension, cholesterol, diabetes, weight and vitamin D deficiency.   BMI is Body mass index is 25.62 kg/m., she has been working on diet and exercise, walking 2 miles 3-4 days a week.  She reports is trying to eat better but struggle in her house hold.  Tends to eat out a lot, at least 1 meal/day due to husband/daughter.  Had chic fil A last night - chicken strips, fries, lemonade  Has been trying to cut down on pasta. Tends to skip breakfast or light - boiled egg and  fruit She also admits to mountain dew and chips, but stopped a few days a go Wt Readings from Last 3 Encounters:  11/03/20 171 lb (77.6 kg)  03/16/20 162 lb 6.4 oz (73.7 kg)  03/14/19 165 lb (74.8 kg)   She does not check BP at home but does have cuff, today their BP is BP: 124/78  She does workout. She denies chest pain, shortness of breath, dizziness.   She is not on cholesterol medication. Her cholesterol is not at goal. The cholesterol last visit was:   Lab Results  Component Value Date   CHOL 202 (H) 03/16/2020   HDL 72 03/16/2020   LDLCALC 105 (H) 03/16/2020   TRIG 140 03/16/2020   CHOLHDL 2.8 03/16/2020    She has been working on diet and exercise for glucose management. Last A1C in the office was:  Lab Results  Component Value Date   HGBA1C 5.6 03/16/2020   Patient is on Vitamin D supplement.   Lab Results  Component Value Date   VD25OH 63 03/16/2020        Current Medications:  Current Outpatient Medications on File Prior to Visit  Medication Sig   Cholecalciferol (VITAMIN D3) 125 MCG (5000 UT) CAPS Take by mouth daily.   hydrochlorothiazide (HYDRODIURIL) 12.5 MG tablet Take 12.5 mg by mouth daily.   levonorgestrel (MIRENA) 20 MCG/24HR IUD 1 each by Intrauterine route once.   Multiple Vitamin (MULTIVITAMIN) tablet Take 1 tablet by mouth daily.   No current facility-administered medications on file prior to visit.     Allergies: No Known Allergies   Medical History:  Past Medical History:  Diagnosis  Date   Allergy    Family history- Reviewed and unchanged Social history- Reviewed and unchanged   Review of Systems:  ROS    Physical Exam: BP 124/78   Pulse 88   Temp (!) 96.8 F (36 C)   Wt 171 lb (77.6 kg)   SpO2 98%   BMI 25.62 kg/m  Wt Readings from Last 3 Encounters:  11/03/20 171 lb (77.6 kg)  03/16/20 162 lb 6.4 oz (73.7 kg)  03/14/19 165 lb (74.8 kg)   General Appearance: Well nourished, in no apparent distress. Eyes: PERRLA,  EOMs, conjunctiva no swelling or erythema Sinuses: No Frontal/maxillary tenderness ENT/Mouth: Ext aud canals clear, TMs without erythema, bulging. No erythema, swelling, or exudate on post pharynx.  Tonsils not swollen or erythematous. Hearing normal.  Neck: Supple, thyroid normal.  Respiratory: Respiratory effort normal, BS equal bilaterally without rales, rhonchi, wheezing or stridor.  Cardio: RRR with no MRGs. Brisk peripheral pulses without edema.  Abdomen: Soft, + BS.  Non tender, no guarding, rebound, hernias, masses. Lymphatics: Non tender without lymphadenopathy.  Musculoskeletal: Full ROM, 5/5 strength, Normal gait Skin: Warm, dry without rashes, lesions, ecchymosis.  Neuro: Cranial nerves intact. No cerebellar symptoms.  Psych: Awake and oriented X 3, normal affect, Insight and Judgment appropriate.    Izora Ribas, NP 4:06 PM [facility]

## 2020-11-03 NOTE — Patient Instructions (Addendum)
Goals      Blood Pressure < 130/80         Recommend tracking food intake - make small changes, adjust to moderate splurging  Try to limit - highly processed carbohydrate (triggers fat storage) And excessively fatty foods (very high calorie)       Consider keeping a food diary- common causes of diarrhea are dairy, certain carbs...   FODMAP stands for fermentable oligo-, di-, mono-saccharides and polyols (1). These are the scientific terms used to classify groups of carbs that are notorious for triggering digestive symptoms like bloating, gas and stomach pain.   FODMAPs are found in a wide range of foods in varying amounts. Some foods contain just one type, while others contain several.  The main dietary sources of the four groups of FODMAPs include:  Oligosaccharides: Wheat, rye, legumes and various fruits and vegetables, such as garlic and onions.  Disaccharides: Milk, yogurt and soft cheese. Lactose is the main carb.  Monosaccharides: Various fruit including figs and mangoes, and sweeteners such as honey and agave nectar. Fructose is the main carb.  Polyols: Certain fruits and vegetables including blackberries and lychee, as well as some low-calorie sweeteners like those in sugar-free gum.   Keep a food diary. This will help you identify foods that cause symptoms. Write down: What you eat and when. What symptoms you have. When symptoms occur in relation to your meals. Avoid foods that cause symptoms. Talk with your dietitian about other ways to get the same nutrients that are in these foods. Eat your meals slowly, in a relaxed setting. Aim to eat 5-6 small meals per day. Do not skip meals. Drink enough fluids to keep your urine clear or pale yellow. Ask your health care provider if you should take an over-the-counter probiotic during flare-ups to help restore healthy gut bacteria. If you have cramping or diarrhea, try making your meals low in fat and high in carbohydrates.  Examples of carbohydrates are pasta, rice, whole grain breads and cereals, fruits, and vegetables. If dairy products cause your symptoms to flare up, try eating less of them. You might be able to handle yogurt better than other dairy products because it contains bacteria that help with digestion.

## 2020-11-04 ENCOUNTER — Encounter: Payer: Self-pay | Admitting: Adult Health

## 2020-11-04 DIAGNOSIS — N182 Chronic kidney disease, stage 2 (mild): Secondary | ICD-10-CM | POA: Insufficient documentation

## 2020-11-04 LAB — COMPLETE METABOLIC PANEL WITH GFR
AG Ratio: 1.7 (calc) (ref 1.0–2.5)
ALT: 9 U/L (ref 6–29)
AST: 15 U/L (ref 10–35)
Albumin: 4.6 g/dL (ref 3.6–5.1)
Alkaline phosphatase (APISO): 73 U/L (ref 31–125)
BUN: 18 mg/dL (ref 7–25)
CO2: 33 mmol/L — ABNORMAL HIGH (ref 20–32)
Calcium: 10.4 mg/dL — ABNORMAL HIGH (ref 8.6–10.2)
Chloride: 102 mmol/L (ref 98–110)
Creat: 1 mg/dL (ref 0.50–1.10)
GFR, Est African American: 77 mL/min/{1.73_m2} (ref >=60)
GFR, Est Non African American: 67 mL/min/{1.73_m2} (ref >=60)
Globulin: 2.7 g/dL (calc) (ref 1.9–3.7)
Glucose, Bld: 89 mg/dL (ref 65–99)
Potassium: 3.9 mmol/L (ref 3.5–5.3)
Sodium: 141 mmol/L (ref 135–146)
Total Bilirubin: 0.3 mg/dL (ref 0.2–1.2)
Total Protein: 7.3 g/dL (ref 6.1–8.1)

## 2020-11-04 LAB — CBC WITH DIFFERENTIAL/PLATELET
Absolute Monocytes: 357 cells/uL (ref 200–950)
Basophils Absolute: 31 cells/uL (ref 0–200)
Basophils Relative: 0.6 %
Eosinophils Absolute: 117 cells/uL (ref 15–500)
Eosinophils Relative: 2.3 %
HCT: 40.5 % (ref 35.0–45.0)
Hemoglobin: 13.7 g/dL (ref 11.7–15.5)
Lymphs Abs: 1821 cells/uL (ref 850–3900)
MCH: 29.9 pg (ref 27.0–33.0)
MCHC: 33.8 g/dL (ref 32.0–36.0)
MCV: 88.4 fL (ref 80.0–100.0)
MPV: 11.4 fL (ref 7.5–12.5)
Monocytes Relative: 7 %
Neutro Abs: 2774 cells/uL (ref 1500–7800)
Neutrophils Relative %: 54.4 %
Platelets: 233 10*3/uL (ref 140–400)
RBC: 4.58 10*6/uL (ref 3.80–5.10)
RDW: 13.1 % (ref 11.0–15.0)
Total Lymphocyte: 35.7 %
WBC: 5.1 10*3/uL (ref 3.8–10.8)

## 2020-11-04 LAB — LIPID PANEL
Cholesterol: 184 mg/dL (ref <200)
HDL: 66 mg/dL (ref >=50)
LDL Cholesterol (Calc): 81 mg/dL (calc)
Non-HDL Cholesterol (Calc): 118 mg/dL (calc) (ref <130)
Total CHOL/HDL Ratio: 2.8 (calc) (ref <5.0)
Triglycerides: 309 mg/dL — ABNORMAL HIGH (ref <150)

## 2020-11-04 LAB — TSH: TSH: 1.43 mIU/L

## 2020-12-21 ENCOUNTER — Other Ambulatory Visit: Payer: Self-pay

## 2020-12-21 MED ORDER — HYDROCHLOROTHIAZIDE 12.5 MG PO TABS: 12.5000 mg | ORAL_TABLET | Freq: Every day | ORAL | 2 refills | Status: DC

## 2021-03-16 NOTE — Progress Notes (Signed)
Complete Physical  Assessment and Plan:  Routine general medical examination at a health care facility 1 year Follows with GYN annually   Hypertension Above goal; increase HCTZ to 25 mg and follow up 4-6 weeks she admits to excess sodium intake, will reduce intake and start monitoring at home Monitor blood pressure at home; call if consistently over 130/80 Continue DASH diet.   Reminder to go to the ER if any CP, SOB, nausea, dizziness, severe HA, changes vision/speech, left arm numbness and tingling and jaw pain. - CBC -CMP/GFR - Magnesium - TSH - Urinalysis - EKG  Mild hyperlipidemia Continue low cholesterol diet and exercise.  Check lipid panel.  - Lipid panel - TSH  Insomnia - good sleep hygiene discussed, increase day time activity - doing better with tea in the evening   Screening for blood or protein in urine - Urinalysis, Routine w reflex microscopic (not at [facility])  Screening for diabetes mellitus - Hemoglobin A1c  Vitamin D deficiency Continue supplement - VITAMIN D 25 Hydroxy (Vit-D Deficiency, Fractures)  Screening for cardiovascular condition -     EKG 12-Lead  BMI 25 Long discussion about weight loss, diet, and exercise Recommended diet heavy in fruits and veggies and low in animal meats, cheeses, and dairy products, appropriate calorie intake Patient will work on reducing eating out, soda (liquid calories)  Start keeping food log and follow up in 4-6 weeks to review Alternate walking and recommended add resistance exercise Discussed appropriate weight for height and initial goal (<160 lb) Follow up at next visit  Need for influenza vaccine Quadrivalent flu vaccine administered without complication today   Black streak R toe nail Limitied visibility with toe nail polish; has increased risk with gel nail/UV exposure hx encouraged evaluation by derm for full body check and r/o melanoma  Orders Placed This Encounter  Procedures   Flu Vaccine QUAD  6+ mos PF IM (Fluarix Quad PF)   CBC with Differential/Platelet   COMPLETE METABOLIC PANEL WITH GFR   Magnesium   Lipid panel   TSH   Hemoglobin A1c   VITAMIN D 25 Hydroxy (Vit-D Deficiency, Fractures)   Microalbumin / creatinine urine ratio   Urinalysis, Routine w reflex microscopic   Ambulatory referral to Gastroenterology   EKG 12-Lead      Discussed med's effects and SE's. Screening labs and tests as requested with regular follow-up as recommended. Over 40 minutes of exam, counseling, chart review and critical decision making was performed  Future Appointments  Date Time Provider [place]  04/20/2021 11:00 AM [name], NP [facility] None  09/16/2021  9:30 AM [name], NP [facility] None  03/17/2022  9:00 AM [name], NP [facility] None     HPI  This very nice 50 y.o.female presents for complete physical. She has Environmental allergies; Mild hyperlipidemia; BMI 24.0-24.9, adult; Vitamin D deficiency; Hypertension; and CKD (chronic kidney disease) stage 2, GFR 60-89 ml/min on their problem list.  She has long term partner, has 69 y/o child together, senior in high school.  Patient reports no complaints at this time. L eye irritated, woke up like this today. Slept with contacts on. Wearing glasses. No crusty discharge.   She [facility], Jamie, NP, she is post menopausal, has progesterone IUD. She was started on estrogen but had BP elevations, has been d/c'd. Declines STD check today. Has some night sweats but improved with drinking   She has intermittent trouble staying asleep, has tried trazodone which helped some,  manages ok, improves when less stressed, doesn't want to take meds.   BMI is Body mass index is 25.7 kg/m., she has been working on diet and exercise, was walking with neighbor 2-2.5 miles 3-4 days week but not in the last month due to being busy. Minimal red meat. Trying to get in 4 bottles of water. May have 1  soda per day instead of coffee. Receptive to reducing. She admits to eating out a lot, attributes weight gain to this.  Wt Readings from Last 3 Encounters:  03/17/21 174 lb (78.9 kg)  11/03/20 171 lb (77.6 kg)  03/16/20 162 lb 6.4 oz (73.7 kg)   She has BP cuff but hasn't been checking, today their BP is BP: 138/88, was started on HCTZ, unsure of dose, does admit sodium intake may be problematic  She does not workout. She denies shortness of breath, dizziness.   She is not on cholesterol medication and denies myalgias. Her cholesterol is not at goal. The cholesterol last visit was:   Lab Results  Component Value Date   CHOL 184 11/03/2020   HDL 66 11/03/2020   LDLCALC 81 11/03/2020   TRIG 309 (H) 11/03/2020   CHOLHDL 2.8 11/03/2020   Last A1C in the office was:  Lab Results  Component Value Date   HGBA1C 5.6 03/16/2020   Last GFR:  Lab Results  Component Value Date   GFRAA 77 11/03/2020   Patient is on Vitamin D supplement, she has MVIT with 800 and will take an additional 2000 2 a day.    Lab Results  Component Value Date   VD25OH 63 03/16/2020        Current Medications:  Current Outpatient Medications on File Prior to Visit  Medication Sig Dispense Refill   Cholecalciferol (VITAMIN D3) 125 MCG (5000 UT) CAPS Take by mouth daily.     levonorgestrel (MIRENA) 20 MCG/24HR IUD 1 each by Intrauterine route once.     Multiple Vitamin (MULTIVITAMIN) tablet Take 1 tablet by mouth daily.     Omega-3 Fatty Acids (FISH OIL OMEGA-3 PO) Take 1 capsule by mouth daily.     No current facility-administered medications on file prior to visit.    Immunization History  Administered Date(s) Administered   Influenza Inj Mdck Quad With Preservative 03/14/2019, 03/16/2020   Influenza,inj,Quad PF,6+ Mos 03/17/2021   PFIZER(Purple Top)SARS-COV-2 Vaccination 09/07/2019, 09/28/2019   Tdap 03/01/2018    Health Maintenance:   TD/TDAP: 2019 Influenza: TODAY Pneumovax: N/A  Prevnar 13:  N/A Covid 19: 2/2, pfizer + booster   LMP: No LMP recorded. (Menstrual status: IUD). Pap: goes to GYN annually, Dr. Candie Mile- sees Roselyn Reef, never abnormal, had early 2022 MGM: [place]- had last year 2020 at [facility]: referral GI   Last eye exam: Mauri Reading, last 2022, wears glasses/contacts Last dental exam: last 02/2021   Medical History:  Past Medical History:  Diagnosis Date   Allergy    Allergies No Known Allergies  SURGICAL HISTORY She  has a past surgical history that includes Cesarean section and Foot surgery. FAMILY HISTORY Her family history includes Cancer (age of onset: 72) in her maternal grandmother; Dementia in her maternal grandfather and paternal grandmother; Diabetes in her father and paternal grandfather; Hyperlipidemia in her father; Hypertension in her father, maternal grandfather, and paternal grandmother; Kidney disease in her maternal uncle; Lymphoma in her maternal aunt; Stroke in her maternal grandfather. SOCIAL HISTORY She  reports that she has never smoked. She has never  used smokeless tobacco. She reports current alcohol use. She reports that she does not use drugs.  Review of Systems: Review of Systems  Constitutional:  Negative for malaise/fatigue and weight loss.  HENT:  Negative for hearing loss and tinnitus.   Eyes:  Negative for blurred vision and double vision.  Respiratory:  Negative for cough, shortness of breath and wheezing.   Cardiovascular:  Negative for chest pain, palpitations, orthopnea, claudication and leg swelling.  Gastrointestinal:  Negative for abdominal pain, blood in stool, constipation (has 3-4 BM per week, daily if increased veggie intake), diarrhea, heartburn, melena, nausea and vomiting.  Genitourinary: Negative.   Musculoskeletal:  Negative for joint pain and myalgias.  Skin:  Negative for rash.  Neurological:  Negative for dizziness, tingling, sensory change, weakness and headaches.  Endo/Heme/Allergies:   Negative for polydipsia.  Psychiatric/Behavioral: Negative.    All other systems reviewed and are negative.  Physical Exam: Estimated body mass index is 25.7 kg/m as calculated from the following:   Height as of this encounter: 5\' 9"  (1.753 m).   Weight as of this encounter: 174 lb (78.9 kg). BP 138/88   Pulse 75   Temp (!) 97.5 F (36.4 C)   Ht 5\' 9"  (1.753 m)   Wt 174 lb (78.9 kg)   SpO2 99%   BMI 25.70 kg/m  General Appearance: Well nourished, well dressed AA female in no apparent distress.  Eyes: PERRLA, EOMs, conjunctiva no swelling or erythema Sinuses: No Frontal/maxillary tenderness  ENT/Mouth: Ext aud canals clear, normal light reflex with TMs without erythema, bulging. Good dentition. No erythema, swelling, or exudate on post pharynx. Tonsils not swollen or erythematous. Hearing normal.  Neck: Supple, thyroid normal. No bruits  Respiratory: Respiratory effort normal, BS equal bilaterally without rales, rhonchi, wheezing or stridor.  Cardio: RRR without murmurs, rubs or gallops. Brisk peripheral pulses without edema.  Chest: symmetric, with normal excursions and percussion.  Breasts: Defer to GYN Abdomen: Soft, nontender, no guarding, rebound, hernias, masses, or organomegaly.  Lymphatics: Non tender without lymphadenopathy.  Genitourinary: defer Musculoskeletal: Full ROM all peripheral extremities,5/5 strength, and normal gait.  Skin:  Warm, dry without rashes, lesions, ecchymosis. R great toe nail with ? Black streak under polish Neuro: Cranial nerves intact, reflexes equal bilaterally. Normal muscle tone, no cerebellar symptoms. Sensation intact.  Psych: Awake and oriented X 3, normal affect, Insight and Judgment appropriate.   EKG: Sinus rhythm, WNL  Gorden Harms Cele Mote 12:19 PM [facility]

## 2021-03-17 ENCOUNTER — Other Ambulatory Visit: Payer: Self-pay

## 2021-03-17 ENCOUNTER — Encounter: Payer: Self-pay | Admitting: Adult Health

## 2021-03-17 ENCOUNTER — Ambulatory Visit (INDEPENDENT_AMBULATORY_CARE_PROVIDER_SITE_OTHER): Payer: No Typology Code available for payment source | Admitting: Adult Health

## 2021-03-17 VITALS — BP 138/88 | HR 75 | Temp 97.5°F | Ht 69.0 in | Wt 174.0 lb

## 2021-03-17 DIAGNOSIS — R7309 Other abnormal glucose: Secondary | ICD-10-CM

## 2021-03-17 DIAGNOSIS — I10 Essential (primary) hypertension: Secondary | ICD-10-CM | POA: Diagnosis not present

## 2021-03-17 DIAGNOSIS — Z0000 Encounter for general adult medical examination without abnormal findings: Secondary | ICD-10-CM | POA: Diagnosis not present

## 2021-03-17 DIAGNOSIS — Z6824 Body mass index (BMI) 24.0-24.9, adult: Secondary | ICD-10-CM

## 2021-03-17 DIAGNOSIS — Z136 Encounter for screening for cardiovascular disorders: Secondary | ICD-10-CM | POA: Diagnosis not present

## 2021-03-17 DIAGNOSIS — Z1211 Encounter for screening for malignant neoplasm of colon: Secondary | ICD-10-CM

## 2021-03-17 DIAGNOSIS — Z1329 Encounter for screening for other suspected endocrine disorder: Secondary | ICD-10-CM

## 2021-03-17 DIAGNOSIS — E559 Vitamin D deficiency, unspecified: Secondary | ICD-10-CM

## 2021-03-17 DIAGNOSIS — N182 Chronic kidney disease, stage 2 (mild): Secondary | ICD-10-CM

## 2021-03-17 DIAGNOSIS — Z23 Encounter for immunization: Secondary | ICD-10-CM | POA: Diagnosis not present

## 2021-03-17 DIAGNOSIS — Z131 Encounter for screening for diabetes mellitus: Secondary | ICD-10-CM

## 2021-03-17 DIAGNOSIS — Z1389 Encounter for screening for other disorder: Secondary | ICD-10-CM

## 2021-03-17 DIAGNOSIS — Z0001 Encounter for general adult medical examination with abnormal findings: Secondary | ICD-10-CM

## 2021-03-17 DIAGNOSIS — Z6825 Body mass index (BMI) 25.0-25.9, adult: Secondary | ICD-10-CM

## 2021-03-17 DIAGNOSIS — E785 Hyperlipidemia, unspecified: Secondary | ICD-10-CM

## 2021-03-17 MED ORDER — HYDROCHLOROTHIAZIDE 25 MG PO TABS: ORAL_TABLET | ORAL | 3 refills | Status: DC

## 2021-03-17 NOTE — Patient Instructions (Addendum)
[name] , Thank you for taking time to come for your Annual Wellness Visit. I appreciate your ongoing commitment to your health goals. Please review the following plan we discussed and let me know if I can assist you in the future.   These are the goals we discussed:  Goals      Blood Pressure < 130/80     Exercise 150 min/wk Moderate Activity     Alternate walking and resistance exercises     Weight (lb) < 160 lb (72.6 kg)        This is a list of the screening recommended for you and due dates:  Health Maintenance  Topic Date Due   Colon Cancer Screening  Never done   COVID-19 Vaccine (3 - Pfizer risk series) 10/26/2019   Pap Smear  05/03/2021   Pneumococcal Vaccination (1 - PCV) 03/17/2036*   Tetanus Vaccine  03/01/2028   HIV Screening  Completed   HPV Vaccine  Aged Out   Flu Shot  Discontinued   Hepatitis C Screening: USPSTF Recommendation to screen - Ages 18-79 yo.  Discontinued  *Topic was postponed. The date shown is not the original due date.     Please send me covid 19 booster date  Recommend keeping blood pressure and food log     Know what a healthy weight is for you (roughly BMI <25) and aim to maintain this  Aim for 7+ servings of fruits and vegetables daily  65-80+ fluid ounces of water or unsweet tea for healthy kidneys  Limit to max 1 drink of alcohol per day; avoid smoking/tobacco  Limit animal fats in diet for cholesterol and heart health - choose grass fed whenever available  Avoid highly processed foods, and foods high in saturated/trans fats  Aim for low stress - take time to unwind and care for your mental health  Aim for 150 min of moderate intensity exercise weekly for heart health, and weights twice weekly for bone health  Aim for 7-9 hours of sleep daily      HYPERTENSION INFORMATION  Monitor your blood pressure at home, please keep a record and bring that in with you to your next office visit.   Go to the ER if any CP, SOB,  nausea, dizziness, severe HA, changes vision/speech  Testing/Procedures: HOW TO TAKE YOUR BLOOD PRESSURE: Rest 5 minutes before taking your blood pressure. Don't smoke or drink caffeinated beverages for at least 30 minutes before. Take your blood pressure before (not after) you eat. Sit comfortably with your back supported and both feet on the floor (don't cross your legs). Elevate your arm to heart level on a table or a desk. Use the proper sized cuff. It should fit smoothly and snugly around your bare upper arm. There should be enough room to slip a fingertip under the cuff. The bottom edge of the cuff should be 1 inch above the crease of the elbow.  Due to a recent study, SPRINT, we have changed our goal for the systolic or top blood pressure number. Ideally we want your top number at 120.  In the [facility] Trial, 5000 people were randomized to a goal BP of 120 and 5000 people were randomized to a goal BP of less than 140. The patients with the goal BP at 120 had LESS DEMENTIA, LESS HEART ATTACKS, AND LESS STROKES, AS WELL AS OVERALL DECREASED MORTALITY OR DEATH RATE.   There was another study that showed taking your blood pressure medications at night  decrease cardiovascular events.  However if you are on a fluid pill, please take this in the morning.   If you are willing, our goal BP is the top number of 120.  Your most recent BP: BP: 138/88   Take your medications faithfully as instructed. Maintain a healthy weight. Get at least 150 minutes of aerobic exercise per week. Minimize salt intake. Minimize alcohol intake  DASH Eating Plan DASH stands for "Dietary Approaches to Stop Hypertension." The DASH eating plan is a healthy eating plan that has been shown to reduce high blood pressure (hypertension). Additional health benefits may include reducing the risk of type 2 diabetes mellitus, heart disease, and stroke. The DASH eating plan may also help with weight loss. WHAT DO I NEED TO KNOW  ABOUT THE DASH EATING PLAN? For the DASH eating plan, you will follow these general guidelines: Choose foods with a percent daily value for sodium of less than 5% (as listed on the food label). Use salt-free seasonings or herbs instead of table salt or sea salt. Check with your health care provider or pharmacist before using salt substitutes. Eat lower-sodium products, often labeled as "lower sodium" or "no salt added." Eat fresh foods. Eat more vegetables, fruits, and low-fat dairy products. Choose whole grains. Look for the word "whole" as the first word in the ingredient list. Choose fish and skinless chicken or [place] more often than red meat. Limit fish, poultry, and meat to 6 oz (170 g) each day. Limit sweets, desserts, sugars, and sugary drinks. Choose heart-healthy fats. Limit cheese to 1 oz (28 g) per day. Eat more home-cooked food and less restaurant, buffet, and fast food. Limit fried foods. Cook foods using methods other than frying. Limit canned vegetables. If you do use them, rinse them well to decrease the sodium. When eating at a restaurant, ask that your food be prepared with less salt, or no salt if possible. WHAT FOODS CAN I EAT? Seek help from a dietitian for individual calorie needs. Grains Whole grain or whole wheat bread. Brown rice. Whole grain or whole wheat pasta. Quinoa, bulgur, and whole grain cereals. Low-sodium cereals. Corn or whole wheat flour tortillas. Whole grain cornbread. Whole grain crackers. Low-sodium crackers. Vegetables Fresh or frozen vegetables (raw, steamed, roasted, or grilled). Low-sodium or reduced-sodium tomato and vegetable juices. Low-sodium or reduced-sodium tomato sauce and paste. Low-sodium or reduced-sodium canned vegetables.  Fruits All fresh, canned (in natural juice), or frozen fruits. Meat and Other Protein Products Ground beef (85% or leaner), grass-fed beef, or beef trimmed of fat. Skinless chicken or [place]. Ground chicken or  [place]. Pork trimmed of fat. All fish and seafood. Eggs. Dried beans, peas, or lentils. Unsalted nuts and seeds. Unsalted canned beans. Dairy Low-fat dairy products, such as skim or 1% milk, 2% or reduced-fat cheeses, low-fat ricotta or cottage cheese, or plain low-fat yogurt. Low-sodium or reduced-sodium cheeses. Fats and Oils Tub margarines without trans fats. Light or reduced-fat mayonnaise and salad dressings (reduced sodium). Avocado. Safflower, olive, or canola oils. Natural peanut or almond butter. Other Unsalted popcorn and pretzels. The items listed above may not be a complete list of recommended foods or beverages. Contact your dietitian for more options. WHAT FOODS ARE NOT RECOMMENDED? Grains White bread. White pasta. White rice. Refined cornbread. Bagels and croissants. Crackers that contain trans fat. Vegetables Creamed or fried vegetables. Vegetables in a cheese sauce. Regular canned vegetables. Regular canned tomato sauce and paste. Regular tomato and vegetable juices. Fruits Dried fruits. Canned fruit in  light or heavy syrup. Fruit juice. Meat and Other Protein Products Fatty cuts of meat. Ribs, chicken wings, bacon, sausage, bologna, salami, chitterlings, fatback, hot dogs, bratwurst, and packaged luncheon meats. Salted nuts and seeds. Canned beans with salt. Dairy Whole or 2% milk, cream, half-and-half, and cream cheese. Whole-fat or sweetened yogurt. Full-fat cheeses or blue cheese. Nondairy creamers and whipped toppings. Processed cheese, cheese spreads, or cheese curds. Condiments Onion and garlic salt, seasoned salt, table salt, and sea salt. Canned and packaged gravies. Worcestershire sauce. Tartar sauce. Barbecue sauce. Teriyaki sauce. Soy sauce, including reduced sodium. Steak sauce. Fish sauce. Oyster sauce. Cocktail sauce. Horseradish. Ketchup and mustard. Meat flavorings and tenderizers. Bouillon cubes. Hot sauce. Tabasco sauce. Marinades. Taco seasonings.  Relishes. Fats and Oils Butter, stick margarine, lard, shortening, ghee, and bacon fat. Coconut, palm kernel, or palm oils. Regular salad dressings. Other Pickles and olives. Salted popcorn and pretzels. The items listed above may not be a complete list of foods and beverages to avoid. Contact your dietitian for more information. WHERE CAN I FIND MORE INFORMATION? National Heart, Lung, and Blood Institute: travelstabloid.com Document Released: 04/14/2011 Document Revised: 09/09/2013 Document Reviewed: 02/27/2013 [facility] Patient Information 2015 [place], [place]. This information is not intended to replace advice given to you by your health care provider. Make sure you discuss any questions you have with your health care provider.

## 2021-03-18 ENCOUNTER — Encounter: Payer: Self-pay | Admitting: Adult Health

## 2021-03-18 LAB — CBC WITH DIFFERENTIAL/PLATELET
Absolute Monocytes: 291 cells/uL (ref 200–950)
Basophils Absolute: 31 cells/uL (ref 0–200)
Basophils Relative: 0.6 %
Eosinophils Absolute: 71 cells/uL (ref 15–500)
Eosinophils Relative: 1.4 %
HCT: 43.4 % (ref 35.0–45.0)
Hemoglobin: 14.4 g/dL (ref 11.7–15.5)
Lymphs Abs: 1586 cells/uL (ref 850–3900)
MCH: 29.8 pg (ref 27.0–33.0)
MCHC: 33.2 g/dL (ref 32.0–36.0)
MCV: 89.7 fL (ref 80.0–100.0)
MPV: 11.2 fL (ref 7.5–12.5)
Monocytes Relative: 5.7 %
Neutro Abs: 3121 cells/uL (ref 1500–7800)
Neutrophils Relative %: 61.2 %
Platelets: 240 10*3/uL (ref 140–400)
RBC: 4.84 10*6/uL (ref 3.80–5.10)
RDW: 13 % (ref 11.0–15.0)
Total Lymphocyte: 31.1 %
WBC: 5.1 10*3/uL (ref 3.8–10.8)

## 2021-03-18 LAB — COMPLETE METABOLIC PANEL WITH GFR
AG Ratio: 1.7 (calc) (ref 1.0–2.5)
ALT: 8 U/L (ref 6–29)
AST: 14 U/L (ref 10–35)
Albumin: 4.8 g/dL (ref 3.6–5.1)
Alkaline phosphatase (APISO): 77 U/L (ref 31–125)
BUN: 13 mg/dL (ref 7–25)
CO2: 30 mmol/L (ref 20–32)
Calcium: 10.7 mg/dL — ABNORMAL HIGH (ref 8.6–10.2)
Chloride: 101 mmol/L (ref 98–110)
Creat: 0.98 mg/dL (ref 0.50–0.99)
Globulin: 2.9 g/dL (calc) (ref 1.9–3.7)
Glucose, Bld: 90 mg/dL (ref 65–99)
Potassium: 4.1 mmol/L (ref 3.5–5.3)
Sodium: 144 mmol/L (ref 135–146)
Total Bilirubin: 0.5 mg/dL (ref 0.2–1.2)
Total Protein: 7.7 g/dL (ref 6.1–8.1)
eGFR: 71 mL/min/{1.73_m2} (ref >=60)

## 2021-03-18 LAB — URINALYSIS, ROUTINE W REFLEX MICROSCOPIC
Bacteria, UA: NONE SEEN /HPF
Bilirubin Urine: NEGATIVE
Glucose, UA: NEGATIVE
Hgb urine dipstick: NEGATIVE
Hyaline Cast: NONE SEEN /LPF
Ketones, ur: NEGATIVE
Nitrite: NEGATIVE
Protein, ur: NEGATIVE
RBC / HPF: NONE SEEN /HPF (ref 0–2)
Specific Gravity, Urine: 1.007 (ref 1.001–1.035)
Squamous Epithelial / HPF: NONE SEEN /HPF (ref <=5)
pH: 6 (ref 5.0–8.0)

## 2021-03-18 LAB — MICROALBUMIN / CREATININE URINE RATIO
Creatinine, Urine: 49 mg/dL (ref 20–275)
Microalb, Ur: <0.2 mg/dL

## 2021-03-18 LAB — HEMOGLOBIN A1C
Hgb A1c MFr Bld: 5.5 % of total Hgb (ref <5.7)
Mean Plasma Glucose: 111 mg/dL
eAG (mmol/L): 6.2 mmol/L

## 2021-03-18 LAB — TSH: TSH: 1.96 mIU/L

## 2021-03-18 LAB — VITAMIN D 25 HYDROXY (VIT D DEFICIENCY, FRACTURES): Vit D, 25-Hydroxy: 96 ng/mL (ref 30–100)

## 2021-03-18 LAB — LIPID PANEL
Cholesterol: 212 mg/dL — ABNORMAL HIGH (ref <200)
HDL: 71 mg/dL (ref >=50)
LDL Cholesterol (Calc): 111 mg/dL (calc) — ABNORMAL HIGH
Non-HDL Cholesterol (Calc): 141 mg/dL (calc) — ABNORMAL HIGH (ref <130)
Total CHOL/HDL Ratio: 3 (calc) (ref <5.0)
Triglycerides: 186 mg/dL — ABNORMAL HIGH (ref <150)

## 2021-03-18 LAB — MAGNESIUM: Magnesium: 2.3 mg/dL (ref 1.5–2.5)

## 2021-03-18 LAB — MICROSCOPIC MESSAGE

## 2021-04-16 NOTE — Progress Notes (Deleted)
Assessment and Plan:  1. Hypertension, unspecified type ***  2. Hypercalcemia ***  3. BMI 25.0-25.9,adult ***  4. CKD (chronic kidney disease) stage 2, GFR 60-89 ml/min ***     Further disposition pending results of labs. Discussed med's effects and SE's.   Over 30 minutes of exam, counseling, chart review, and critical decision making was performed.   Future Appointments  Date Time Provider [place]  04/20/2021 11:00 AM [name], NP [facility] None  09/16/2021  9:30 AM [name], NP [facility] None  03/17/2022  9:00 AM [name], NP [facility] None    ------------------------------------------------------------------------------------------------------------------   HPI There were no vitals taken for this visit. 50 y.o.female presents for follow up on htn, weight and persistent mild hypercalcemia.   HCTZ was increased, low sodium diet *** Her blood pressure {HAS HAS NOT:18834} been controlled at home, today their BP is    She {DOES_DOES PXT:06269} workout. She denies chest pain, shortness of breath, dizziness.   BMI is There is no height or weight on file to calculate BMI., she {HAS HAS SWN:46270} been working on diet and exercise. Food log *** Wt Readings from Last 3 Encounters:  03/17/21 174 lb (78.9 kg)  11/03/20 171 lb (77.6 kg)  03/16/20 162 lb 6.4 oz (73.7 kg)   Last 3 checks with persistent hypercalcemia *** Lab Results  Component Value Date   CALCIUM 10.7 (H) 03/17/2021     Past Medical History:  Diagnosis Date   Allergy      No Known Allergies  Current Outpatient Medications on File Prior to Visit  Medication Sig   Cholecalciferol (VITAMIN D3) 125 MCG (5000 UT) CAPS Take by mouth daily.   hydrochlorothiazide (HYDRODIURIL) 25 MG tablet Take 1 tab daily in the morning for blood pressure goal <130/80.   levonorgestrel (MIRENA) 20 MCG/24HR IUD 1 each by Intrauterine route once.   Multiple Vitamin (MULTIVITAMIN) tablet  Take 1 tablet by mouth daily.   Omega-3 Fatty Acids (FISH OIL OMEGA-3 PO) Take 1 capsule by mouth daily.   No current facility-administered medications on file prior to visit.    ROS: all negative except above.   Physical Exam:  There were no vitals taken for this visit.  General Appearance: Well nourished, in no apparent distress. Eyes: PERRLA, EOMs, conjunctiva no swelling or erythema Sinuses: No Frontal/maxillary tenderness ENT/Mouth: Ext aud canals clear, TMs without erythema, bulging. No erythema, swelling, or exudate on post pharynx.  Tonsils not swollen or erythematous. Hearing normal.  Neck: Supple, thyroid normal.  Respiratory: Respiratory effort normal, BS equal bilaterally without rales, rhonchi, wheezing or stridor.  Cardio: RRR with no MRGs. Brisk peripheral pulses without edema.  Abdomen: Soft, + BS.  Non tender, no guarding, rebound, hernias, masses. Lymphatics: Non tender without lymphadenopathy.  Musculoskeletal: Full ROM, 5/5 strength, normal gait.  Skin: Warm, dry without rashes, lesions, ecchymosis.  Neuro: Cranial nerves intact. Normal muscle tone, no cerebellar symptoms. Sensation intact.  Psych: Awake and oriented X 3, normal affect, Insight and Judgment appropriate.     Izora Ribas, NP 1:57 PM [facility]

## 2021-04-20 ENCOUNTER — Ambulatory Visit: Payer: No Typology Code available for payment source | Admitting: Adult Health

## 2021-04-20 DIAGNOSIS — Z6825 Body mass index (BMI) 25.0-25.9, adult: Secondary | ICD-10-CM

## 2021-04-20 DIAGNOSIS — N182 Chronic kidney disease, stage 2 (mild): Secondary | ICD-10-CM

## 2021-04-20 DIAGNOSIS — I10 Essential (primary) hypertension: Secondary | ICD-10-CM

## 2021-05-28 NOTE — Progress Notes (Signed)
FOLLOW UP  Assessment and Plan:   Hypertension Above goal with admitted med poor compliance and high sodium diet this past month; she would like to work on lifestyle prior to further changes Monitor blood pressure at home; patient to call if consistently greater than 130/80 in 1-2 weeks, plan to add losartan if needed Continue DASH diet.   Reminder to go to the ER if any CP, SOB, nausea, dizziness, severe HA, changes vision/speech, left arm numbness and tingling and jaw pain.  Overweight Long discussion about weight loss, diet, and exercise Recommended diet heavy in fruits and veggies and low in animal meats, cheeses, and dairy products, appropriate calorie intake Discussed ideal weight for height  Patient will work on reducing eating out, fast food spurges, processed carbohydrate Keep food log to help track  Will follow up in 4-6 weeks  Deferred labs today, will check next visit with close follow up in 4-6 weeks after discussion  Continue diet and meds as discussed.  Over 20 minutes of exam, counseling, chart review, and critical decision making was performed.   Future Appointments  Date Time Provider [place]  09/16/2021  9:30 AM [name], NP [facility] None  03/17/2022  9:00 AM [name], NP [facility] None    ----------------------------------------------------------------------------------------------------------------------  HPI 51 y.o. female  presents for 6 week month follow up on hypertension, weight hypercalcemia and vitamin D deficiency.   She admits today did not make changes that we recommended last visit.   BMI is Body mass index is 26.43 kg/m., she had not been working on diet and exercise, but just started last week, just joined a gym and plans to start, goal is 3 days.  She reports is trying to eat better but struggle in her house hold.  Eating out a lot, holidays were difficult, lots of high sodium pork, processed carb. This week got some  salmon, veggies and doing eggs and fruit smoothie for breakfast.  Wt Readings from Last 3 Encounters:  06/01/21 179 lb (81.2 kg)  03/17/21 174 lb (78.9 kg)  11/03/20 171 lb (77.6 kg)   She does not check BP at home but does have cuff, today their BP is BP: (!) 130/98, admits hasn't been taking HCTZ consistently over the last month, taking regularly in the last 3 days with new diet, has a plan for consistently, doing a lower sodium diet.   She does workout. She denies chest pain, shortness of breath, dizziness.   Current Medications:  Current Outpatient Medications on File Prior to Visit  Medication Sig   Cholecalciferol (VITAMIN D3) 125 MCG (5000 UT) CAPS Take by mouth daily.   hydrochlorothiazide (HYDRODIURIL) 25 MG tablet Take 1 tab daily in the morning for blood pressure goal <130/80.   levonorgestrel (MIRENA) 20 MCG/24HR IUD 1 each by Intrauterine route once.   Multiple Vitamin (MULTIVITAMIN) tablet Take 1 tablet by mouth daily.   Omega-3 Fatty Acids (FISH OIL OMEGA-3 PO) Take 1 capsule by mouth daily.   No current facility-administered medications on file prior to visit.     Allergies: No Known Allergies   Medical History:  Past Medical History:  Diagnosis Date   Allergy    Family history- Reviewed and unchanged Social history- Reviewed and unchanged   Review of Systems:  Review of Systems  Constitutional:  Negative for malaise/fatigue and weight loss.  HENT:  Negative for hearing loss and tinnitus.   Eyes:  Negative for blurred vision and double vision.  Respiratory:  Negative for cough,  shortness of breath and wheezing.   Cardiovascular:  Negative for chest pain, palpitations, orthopnea, claudication and leg swelling.  Gastrointestinal:  Negative for abdominal pain, blood in stool, constipation, diarrhea, heartburn, melena, nausea and vomiting.  Genitourinary: Negative.   Musculoskeletal:  Negative for joint pain and myalgias.  Skin:  Negative for rash.   Neurological:  Negative for dizziness, tingling, sensory change, weakness and headaches.  Endo/Heme/Allergies:  Negative for polydipsia.  Psychiatric/Behavioral: Negative.    All other systems reviewed and are negative.    Physical Exam: BP (!) 130/98    Pulse 76    Temp 97.7 F (36.5 C)    Wt 179 lb (81.2 kg)    SpO2 99%    BMI 26.43 kg/m  Wt Readings from Last 3 Encounters:  06/01/21 179 lb (81.2 kg)  03/17/21 174 lb (78.9 kg)  11/03/20 171 lb (77.6 kg)   General Appearance: Well nourished, in no apparent distress. Eyes: PERRLA, EOMs, conjunctiva no swelling or erythema Sinuses: No Frontal/maxillary tenderness ENT/Mouth: Ext aud canals clear, TMs without erythema, bulging. No erythema, swelling, or exudate on post pharynx.  Tonsils not swollen or erythematous. Hearing normal.  Neck: Supple, thyroid normal.  Respiratory: Respiratory effort normal, BS equal bilaterally without rales, rhonchi, wheezing or stridor.  Cardio: RRR with no MRGs. Brisk peripheral pulses without edema.  Abdomen: Soft, + BS.  Non tender, no guarding, rebound, hernias, masses. Lymphatics: Non tender without lymphadenopathy.  Musculoskeletal: Full ROM, 5/5 strength, Normal gait Skin: Warm, dry without rashes, lesions, ecchymosis.  Neuro: Cranial nerves intact. No cerebellar symptoms.  Psych: Awake and oriented X 3, normal affect, Insight and Judgment appropriate.    Izora Ribas, NP 4:38 PM [facility]

## 2021-06-01 ENCOUNTER — Ambulatory Visit (INDEPENDENT_AMBULATORY_CARE_PROVIDER_SITE_OTHER): Payer: No Typology Code available for payment source | Admitting: Adult Health

## 2021-06-01 ENCOUNTER — Other Ambulatory Visit: Payer: Self-pay

## 2021-06-01 ENCOUNTER — Encounter: Payer: Self-pay | Admitting: Adult Health

## 2021-06-01 VITALS — BP 130/98 | HR 76 | Temp 97.7°F | Wt 179.0 lb

## 2021-06-01 DIAGNOSIS — E663 Overweight: Secondary | ICD-10-CM

## 2021-06-01 DIAGNOSIS — Z6825 Body mass index (BMI) 25.0-25.9, adult: Secondary | ICD-10-CM

## 2021-06-01 DIAGNOSIS — I10 Essential (primary) hypertension: Secondary | ICD-10-CM

## 2021-06-01 DIAGNOSIS — E559 Vitamin D deficiency, unspecified: Secondary | ICD-10-CM

## 2021-07-14 ENCOUNTER — Ambulatory Visit: Payer: No Typology Code available for payment source | Admitting: Adult Health

## 2021-08-24 ENCOUNTER — Encounter: Payer: Self-pay | Admitting: Adult Health

## 2021-08-24 ENCOUNTER — Ambulatory Visit (INDEPENDENT_AMBULATORY_CARE_PROVIDER_SITE_OTHER): Payer: No Typology Code available for payment source | Admitting: Adult Health

## 2021-08-24 VITALS — BP 120/86 | HR 76 | Temp 97.9°F | Wt 180.0 lb

## 2021-08-24 DIAGNOSIS — I10 Essential (primary) hypertension: Secondary | ICD-10-CM | POA: Diagnosis not present

## 2021-08-24 DIAGNOSIS — R238 Other skin changes: Secondary | ICD-10-CM

## 2021-08-24 DIAGNOSIS — E559 Vitamin D deficiency, unspecified: Secondary | ICD-10-CM

## 2021-08-24 DIAGNOSIS — E785 Hyperlipidemia, unspecified: Secondary | ICD-10-CM

## 2021-08-24 DIAGNOSIS — E663 Overweight: Secondary | ICD-10-CM

## 2021-08-24 DIAGNOSIS — B009 Herpesviral infection, unspecified: Secondary | ICD-10-CM

## 2021-08-24 MED ORDER — METRONIDAZOLE 0.75 % VA GEL: 1.0000 | Freq: Every day | VAGINAL | 1 refills | Status: AC

## 2021-08-24 MED ORDER — ACYCLOVIR 400 MG PO TABS: ORAL_TABLET | ORAL | 0 refills | Status: DC

## 2021-08-24 NOTE — Progress Notes (Signed)
?FOLLOW UP ? ?Assessment and Plan:  ? ?[name] was seen today for follow-up. ? ?Diagnoses and all orders for this visit: ? ?Hypertension, unspecified type ?Continue medications; much improved with lifestyle changes, she is motivated to continue  ?Monitor blood pressure at home; call if consistently over 130/80 ?Continue DASH diet.   ?Reminder to go to the ER if any CP, SOB, nausea, dizziness, severe HA, changes vision/speech, left arm numbness and tingling and jaw pain. ?-     COMPLETE METABOLIC PANEL WITH GFR ? ?Overweight (BMI 25.0-29.9) ?Long discussion about weight loss, diet, and exercise ?Recommended diet heavy in fruits and veggies and low in animal meats, cheeses, and dairy products, appropriate calorie intake ?Patient will continue to work with personal trainer with resistance exercises and meal plan, they are monitoring fat% ?Discussed appropriate weight for height ?Follow up at next visit ? ?Hypercalcemia ?-     COMPLETE METABOLIC PANEL WITH GFR ?-     Parathyroid hormone, intact (no Ca) ? ?Vitamin D deficiency ?Advised to reduce dose from 4800 IU to 2800 IU due to calcium levels; recheck at CPE  ? ?Mild hyperlipidemia ?Working on lifestyle ?Continue low cholesterol diet and exercise.  ?Check lipid panel.  ?-     COMPLETE METABOLIC PANEL WITH GFR ?-     Lipid panel ? 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 Hold remaining tabs to try immediately with onset of next outbreak. Return while still has intact vesicles if possible and can do a viral culture to confirm -  ?-     HSV(herpes simplex vrs) 1+2 ab-IgG ?-     acyclovir (ZOVIRAX) 400 MG tablet; Take 1 tab three times a day for viral rash for 5 days. Start as soon as symptoms appear. ? ?Other orders ?-     metroNIDAZOLE (METROGEL) 0.75 % vaginal gel; Place 1 Applicatorful vaginally at bedtime. 1 applicator at night for 5 days ? ? ?Continue diet and meds as discussed.  ?Over 30 minutes of  exam, counseling, chart review, and critical decision making was performed.  ? ?Future Appointments  ?Date Time Provider [place]  ?09/16/2021  9:30 AM [name], NP [facility] None  ?03/17/2022  9:00 AM [name], NP [facility] None  ? ? ?---------------------------------------------------------------------------------------------------------------------- ? ?HPI ?51 y.o. female  presents for 6 week month follow up on hypertension, weight hypercalcemia and vitamin D deficiency/ 6 month lipids follow up.  ? ?She is also requesting evaluation of recurrent pruritic vesicular rash to buttocks. 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 Outbreaks are typically in same location (buttocks), vesicles then scabs over to heal over several weeks.  ? ?BMI is Body mass index is 26.58 kg/m?., she had not been working on diet and exercise. She reports joined [employer] but never went, recently went to gym with her friend and has been going regularly in the last 1-2 weeks with her friend. Working with a Physiological scientist who is helping her with meal plan as well.  ?Working to do protein, veggies and fruit ?Protein shake with peanut butter powder  ?Wt Readings from Last 3 Encounters:  ?08/24/21 180 lb (81.6 kg)  ?06/01/21 179 lb (81.2 kg)  ?03/17/21 174 lb (78.9 kg)  ? ?She does check BP at home, has been well controlled, today their BP is BP: 120/86, doing a lower sodium diet.  ? She does workout. She denies chest pain, shortness of breath, dizziness. ? ?Lab  Results  ?Component Value Date  ? CHOL 212 (H) 03/17/2021  ? HDL 71 03/17/2021  ? LDLCALC 111 (H) 03/17/2021  ? TRIG 186 (H) 03/17/2021  ? CHOLHDL 3.0 03/17/2021  ? ?Hx of vit def on supplement; She has been taking 4000 IU + 800 iu from her [employer].  ?Lab Results  ?Component Value Date  ? VD25OH 96 03/17/2021  ?   ?Denies calcium supplement or tums use -  ?Lab Results  ?Component Value Date  ? CALCIUM 10.7 (H) 03/17/2021  ? ? ? ? ?Current  Medications:  ?Current Outpatient Medications on File Prior to Visit  ?Medication Sig  ? Cholecalciferol 50 MCG (2000 UT) CHEW Chew 1 tablet by mouth daily.  ? ELDERBERRY PO Take by mouth daily.  ? hydrochlorothiazide (HYDRODIURIL) 25 MG tablet Take 1 tab daily in the morning for blood pressure goal <130/80.  ? levonorgestrel (MIRENA) 20 MCG/24HR IUD 1 each by Intrauterine route once.  ? Multiple Vitamin (MULTIVITAMIN) tablet Take 1 tablet by mouth daily.  ? Omega-3 Fatty Acids (FISH OIL OMEGA-3 PO) Take 1 capsule by mouth daily.  ? ?No current facility-administered medications on file prior to visit.  ? ? ? ?Allergies: No Known Allergies  ? ?Medical History:  ?Past Medical History:  ?Diagnosis Date  ? Allergy   ? ?Family history- Reviewed and unchanged ?Social history- Reviewed and unchanged ? ? ?Review of Systems:  ?Review of Systems  ?Constitutional:  Negative for malaise/fatigue and weight loss.  ?HENT:  Negative for hearing loss and tinnitus.   ?Eyes:  Negative for blurred vision and double vision.  ?Respiratory:  Negative for cough, shortness of breath and wheezing.   ?Cardiovascular:  Negative for chest pain, palpitations, orthopnea, claudication and leg swelling.  ?Gastrointestinal:  Negative for abdominal pain, blood in stool, constipation, diarrhea, heartburn, melena, nausea and vomiting.  ?Genitourinary: Negative.   ?Musculoskeletal:  Negative for joint pain and myalgias.  ?Skin:  Positive for rash (recurrent purritic clustered vesicles to buttocks).  ?Neurological:  Negative for dizziness, tingling, sensory change, weakness and headaches.  ?Endo/Heme/Allergies:  Negative for polydipsia.  ?Psychiatric/Behavioral: Negative.    ?All other systems reviewed and are negative. ? ? ? ?Physical Exam: ?BP 120/86   Pulse 76   Temp 97.9 ?F (36.6 ?C)   Wt 180 lb (81.6 kg)   SpO2 99%   BMI 26.58 kg/m?  ?Wt Readings from Last 3 Encounters:  ?08/24/21 180 lb (81.6 kg)  ?06/01/21 179 lb (81.2 kg)  ?03/17/21 174 lb  (78.9 kg)  ? ?General Appearance: Well nourished, in no apparent distress. ?Eyes: PERRLA, EOMs, conjunctiva no swelling or erythema ?Sinuses: No Frontal/maxillary tenderness ?ENT/Mouth: Ext aud canals clear, TMs without erythema, bulging. No erythema, swelling, or exudate on post pharynx.  Tonsils not swollen or erythematous. Hearing normal.  ?Neck: Supple, thyroid normal.  ?Respiratory: Respiratory effort normal, BS equal bilaterally without rales, rhonchi, wheezing or stridor.  ?Cardio: RRR with no MRGs. Brisk peripheral pulses without edema.  ?Abdomen: Soft, + BS.  Non tender, no guarding, rebound, hernias, masses. ?Lymphatics: Non tender without lymphadenopathy.  ?Musculoskeletal: Full ROM, 5/5 strength, Normal gait ?Skin: Warm, dry without ecchymosis. R mid buttock with healing excoriated area, similar to L upper cheek.  ?Neuro: Cranial nerves intact. No cerebellar symptoms.  ?Psych: Awake and oriented X 3, normal affect, Insight and Judgment appropriate.  ? ? ?Izora Ribas, NP ?4:36 PM ?[facility] ? ?

## 2021-08-25 LAB — COMPLETE METABOLIC PANEL WITH GFR
AG Ratio: 1.6 (calc) (ref 1.0–2.5)
ALT: 16 U/L (ref 6–29)
AST: 20 U/L (ref 10–35)
Albumin: 4.6 g/dL (ref 3.6–5.1)
Alkaline phosphatase (APISO): 72 U/L (ref 31–125)
BUN: 20 mg/dL (ref 7–25)
CO2: 32 mmol/L (ref 20–32)
Calcium: 9.8 mg/dL (ref 8.6–10.2)
Chloride: 102 mmol/L (ref 98–110)
Creat: 0.96 mg/dL (ref 0.50–0.99)
Globulin: 2.9 g/dL (calc) (ref 1.9–3.7)
Glucose, Bld: 88 mg/dL (ref 65–99)
Potassium: 4 mmol/L (ref 3.5–5.3)
Sodium: 142 mmol/L (ref 135–146)
Total Bilirubin: 0.4 mg/dL (ref 0.2–1.2)
Total Protein: 7.5 g/dL (ref 6.1–8.1)
eGFR: 73 mL/min/{1.73_m2} (ref >=60)

## 2021-08-25 LAB — LIPID PANEL
Cholesterol: 171 mg/dL (ref <200)
HDL: 62 mg/dL (ref >=50)
LDL Cholesterol (Calc): 82 mg/dL (calc)
Non-HDL Cholesterol (Calc): 109 mg/dL (calc) (ref <130)
Total CHOL/HDL Ratio: 2.8 (calc) (ref <5.0)
Triglycerides: 176 mg/dL — ABNORMAL HIGH (ref <150)

## 2021-08-25 LAB — HSV(HERPES SIMPLEX VRS) I + II AB-IGG
HAV 1 IGG,TYPE SPECIFIC AB: 46.5 index — ABNORMAL HIGH
HSV 2 IGG,TYPE SPECIFIC AB: >23 index — ABNORMAL HIGH

## 2021-08-25 LAB — PARATHYROID HORMONE, INTACT (NO CA): PTH: 52 pg/mL (ref 16–77)

## 2021-08-26 ENCOUNTER — Encounter: Payer: Self-pay | Admitting: Adult Health

## 2021-08-26 ENCOUNTER — Ambulatory Visit: Payer: No Typology Code available for payment source | Admitting: Adult Health

## 2021-08-26 DIAGNOSIS — B009 Herpesviral infection, unspecified: Secondary | ICD-10-CM | POA: Insufficient documentation

## 2021-08-26 DIAGNOSIS — R768 Other specified abnormal immunological findings in serum: Secondary | ICD-10-CM | POA: Insufficient documentation

## 2021-09-16 ENCOUNTER — Ambulatory Visit: Payer: No Typology Code available for payment source | Admitting: Adult Health

## 2022-03-16 NOTE — Progress Notes (Signed)
 Complete Physical  Assessment and Plan:  Routine general medical examination at a health care facility 1 year Follows with GYN annually   Hypertension Continue HCTZ 25 mg daily Discussed persistent diastolic elevation; she admits to excess sodium intake, will reduce intake and start monitoring at home Monitor blood pressure at home; call if consistently over 130/80 Continue DASH diet.   Reminder to go to the ER if any CP, SOB, nausea, dizziness, severe HA, changes vision/speech, left arm numbness and tingling and jaw pain. - CBC -CMP/GFR - Magnesium - TSH - Urinalysis - EKG  Mild hyperlipidemia Continue low cholesterol diet and exercise.  Check lipid panel.  - Lipid panel - TSH  CKD stage 2 Increase fluids, avoid NSAIDS, monitor sugars, will monitor   Insomnia - good sleep hygiene discussed, increase day time activity - some benefit with trazodone but patient prefers to avoid meds, monitor for now, consider belsomra due to difficulty staying asleep   Screening for blood or protein in urine - Urinalysis, Routine w reflex microscopic (not at [facility]) - Microalbumin/creatinine urine ratio  Screening for diabetes mellitus - Hemoglobin A1c  Vitamin D deficiency Continue supplement - VITAMIN D 25 Hydroxy (Vit-D Deficiency, Fractures)  Screening for ischemic heart disease -     EKG 12-Lead  Screening for AAA - U/S ABD Retroperitoneal LTD  Screening for thyroid disorder - TSH  Need for influenza vaccine Quadrivalent flu vaccine administered without complication today   Medication Management - Magnesium    Discussed med's effects and SE's. Screening labs and tests as requested with regular follow-up as recommended. Over 40 minutes of exam, counseling, chart review and critical decision making was performed  Future Appointments  Date Time Provider [place]  03/22/2023 10:00 AM Autumn Rossetti, NP [facility] None     HPI  This very nice 51  y.o.female presents for complete physical. She has Environmental allergies; Mild hyperlipidemia; Overweight (BMI 25.0-29.9); Vitamin D deficiency; Hypertension; CKD (chronic kidney disease) stage 2, GFR 60-89 ml/min; Hypercalcemia; HSV-2 seropositive; and HSV infection on their problem list.  She has long term partner, has 25 y/o child daughter together, freshman at [employer]- her daughter is struggling with college   Patient reports no complaints at this time.     She [facility] GYN[facility], Jamie, NP, she is post menopausal, has progesterone IUD. She was started on estrogen but had BP elevations, has been d/c'd. Was having some dyspareunia, but reports has been managing well with coconut oil. Declines STD check today. Mammogram is up to date.   She has intermittent trouble staying asleep, has tried trazodone which helped some, manages ok, improves when less stressed, doesn't want to take meds.   BMI is Body mass index is 25.83 kg/m., she has been working on diet and exercise, has not been walking as much, plans to restart wall pilates Minimal red meat Trying to get in 4 bottles of water. May have 1 soda per day instead of coffee.  Wt Readings from Last 3 Encounters:  03/21/22 172 lb 6.4 oz (78.2 kg)  08/24/21 180 lb (81.6 kg)  06/01/21 179 lb (81.2 kg)   BP is currently well controlled with HCTZ 25 mg QD, today their BP is BP: 120/78,  BP Readings from Last 3 Encounters:  03/21/22 120/78  08/24/21 120/86  06/01/21 (!) 130/98  She does not workout. She denies shortness of breath, dizziness, headaches and chest pain.   She is not on cholesterol medication and denies myalgias. Her  cholesterol is not at goal. The cholesterol last visit was:   Lab Results  Component Value Date   CHOL 171 08/24/2021   HDL 62 08/24/2021   LDLCALC 82 08/24/2021   TRIG 176 (H) 08/24/2021   CHOLHDL 2.8 08/24/2021   Last A1C in the office was:  Lab Results  Component Value Date   HGBA1C 5.5  03/17/2021   Last GFR:  Lab Results  Component Value Date   EGFR 73 08/24/2021    Patient is on Vitamin D supplement, she has MVIT with 800 and will take an additional 2000 2 a day.    Lab Results  Component Value Date   VD25OH 96 03/17/2021        Current Medications:  Current Outpatient Medications on File Prior to Visit  Medication Sig Dispense Refill   Cholecalciferol 50 MCG (2000 UT) CHEW Chew 1 tablet by mouth daily.     ELDERBERRY PO Take by mouth daily.     hydrochlorothiazide (HYDRODIURIL) 25 MG tablet Take 1 tab daily in the morning for blood pressure goal <130/80. 90 tablet 3   levonorgestrel (MIRENA) 20 MCG/24HR IUD 1 each by Intrauterine route once.     metroNIDAZOLE (METROGEL) 0.75 % vaginal gel Place 1 Applicatorful vaginally at bedtime. 1 applicator at night for 5 days 70 g 1   Multiple Vitamin (MULTIVITAMIN) tablet Take 1 tablet by mouth daily.     Omega-3 Fatty Acids (FISH OIL OMEGA-3 PO) Take 1 capsule by mouth daily.     acyclovir (ZOVIRAX) 400 MG tablet Take 1 tab three times a day for viral rash for 5 days. Start as soon as symptoms appear. (Patient not taking: Reported on 03/21/2022) 30 tablet 0   No current facility-administered medications on file prior to visit.    Immunization History  Administered Date(s) Administered   Influenza Inj Mdck Quad With Preservative 03/14/2019, 03/16/2020   Influenza,inj,Quad PF,6+ Mos 03/17/2021   PFIZER(Purple Top)SARS-COV-2 Vaccination 09/07/2019, 09/28/2019   Tdap 03/01/2018    Health Maintenance:   TD/TDAP: 2019 Influenza: 03/2019 TODAY Pneumovax: N/A Prevnar 13: N/A Covid 19: 2/2, pfizer 09/2019  LMP: No LMP recorded. (Menstrual status: IUD). Pap: goes to GYN annually, Dr. Candie Valdez- sees NP, never abnormal  MGM: Goes GYN- had  2023 at GYN   Last eye exam: [name], last 10/2019, wears glasses Last dental exam: last 2021  Medical History:  Past Medical History:  Diagnosis Date   Allergy     Allergies No Known Allergies  SURGICAL HISTORY She  has a past surgical history that includes Cesarean section and Foot surgery. FAMILY HISTORY Her family history includes Cancer (age of onset: 41) in her maternal grandmother; Dementia in her maternal grandfather and paternal grandmother; Diabetes in her father and paternal grandfather; Hyperlipidemia in her father; Hypertension in her father, maternal grandfather, and paternal grandmother; Kidney disease in her maternal uncle; Lymphoma in her maternal aunt; Stroke in her maternal grandfather. SOCIAL HISTORY She  reports that she has never smoked. She has never used smokeless tobacco. She reports current alcohol use. She reports that she does not use drugs.  Review of Systems: Review of Systems  Constitutional:  Negative for malaise/fatigue and weight loss.  HENT:  Negative for hearing loss and tinnitus.   Eyes:  Negative for blurred vision and double vision.  Respiratory:  Negative for cough, shortness of breath and wheezing.   Cardiovascular:  Negative for chest pain, palpitations, orthopnea, claudication and leg swelling.  Gastrointestinal:  Negative for abdominal  pain, blood in stool, constipation, diarrhea, heartburn, melena, nausea and vomiting.  Genitourinary: Negative.   Musculoskeletal:  Negative for joint pain and myalgias.  Skin:  Negative for rash.  Neurological:  Negative for dizziness, tingling, sensory change, weakness and headaches.  Endo/Heme/Allergies:  Negative for polydipsia.  Psychiatric/Behavioral: Negative.    All other systems reviewed and are negative.   Physical Exam: Estimated body mass index is 25.83 kg/m as calculated from the following:   Height as of this encounter: 5' 8.5" (1.74 m).   Weight as of this encounter: 172 lb 6.4 oz (78.2 kg). BP 120/78   Pulse 72   Temp (!) 97.5 F (36.4 C)   Ht 5' 8.5" (1.74 m)   Wt 172 lb 6.4 oz (78.2 kg)   SpO2 98%   BMI 25.83 kg/m  General Appearance: Well  nourished, well dressed AA female in no apparent distress.  Eyes: PERRLA, EOMs, conjunctiva no swelling or erythema Sinuses: No Frontal/maxillary tenderness  ENT/Mouth: Ext aud canals clear, normal light reflex with TMs without erythema, bulging. Good dentition. No erythema, swelling, or exudate on post pharynx. Tonsils not swollen or erythematous. Hearing normal.  Neck: Supple, thyroid normal. No bruits  Respiratory: Respiratory effort normal, BS equal bilaterally without rales, rhonchi, wheezing or stridor.  Cardio: RRR without murmurs, rubs or gallops. Brisk peripheral pulses without edema.  Chest: symmetric, with normal excursions and percussion.  Breasts: Defer to GYN Abdomen: Soft, nontender, no guarding, rebound, hernias, masses, or organomegaly.  Lymphatics: Non tender without lymphadenopathy.  Genitourinary: defer Musculoskeletal: Full ROM all peripheral extremities,5/5 strength, and normal gait.  Skin:  Warm, dry without rashes, lesions, ecchymosis. Neuro: Cranial nerves intact, reflexes equal bilaterally. Normal muscle tone, no cerebellar symptoms. Sensation intact.  Psych: Awake and oriented X 3, normal affect, Insight and Judgment appropriate.   EKG: Sinus rhythm, WNL AAA:  < 3 cm   [name] E  10:32 AM [facility]

## 2022-03-17 ENCOUNTER — Encounter: Payer: No Typology Code available for payment source | Admitting: Adult Health

## 2022-03-21 ENCOUNTER — Encounter: Payer: Self-pay | Admitting: Nurse Practitioner

## 2022-03-21 ENCOUNTER — Ambulatory Visit (INDEPENDENT_AMBULATORY_CARE_PROVIDER_SITE_OTHER): Payer: No Typology Code available for payment source | Admitting: Nurse Practitioner

## 2022-03-21 VITALS — BP 120/78 | HR 72 | Temp 97.5°F | Ht 68.5 in | Wt 172.4 lb

## 2022-03-21 DIAGNOSIS — I700 Atherosclerosis of aorta: Secondary | ICD-10-CM

## 2022-03-21 DIAGNOSIS — Z0000 Encounter for general adult medical examination without abnormal findings: Secondary | ICD-10-CM | POA: Diagnosis not present

## 2022-03-21 DIAGNOSIS — Z131 Encounter for screening for diabetes mellitus: Secondary | ICD-10-CM

## 2022-03-21 DIAGNOSIS — E663 Overweight: Secondary | ICD-10-CM

## 2022-03-21 DIAGNOSIS — N182 Chronic kidney disease, stage 2 (mild): Secondary | ICD-10-CM

## 2022-03-21 DIAGNOSIS — Z1329 Encounter for screening for other suspected endocrine disorder: Secondary | ICD-10-CM

## 2022-03-21 DIAGNOSIS — Z136 Encounter for screening for cardiovascular disorders: Secondary | ICD-10-CM | POA: Diagnosis not present

## 2022-03-21 DIAGNOSIS — Z23 Encounter for immunization: Secondary | ICD-10-CM

## 2022-03-21 DIAGNOSIS — E785 Hyperlipidemia, unspecified: Secondary | ICD-10-CM

## 2022-03-21 DIAGNOSIS — I10 Essential (primary) hypertension: Secondary | ICD-10-CM | POA: Diagnosis not present

## 2022-03-21 DIAGNOSIS — Z0001 Encounter for general adult medical examination with abnormal findings: Secondary | ICD-10-CM

## 2022-03-21 DIAGNOSIS — G4700 Insomnia, unspecified: Secondary | ICD-10-CM

## 2022-03-21 DIAGNOSIS — E559 Vitamin D deficiency, unspecified: Secondary | ICD-10-CM

## 2022-03-21 DIAGNOSIS — Z1389 Encounter for screening for other disorder: Secondary | ICD-10-CM

## 2022-03-21 DIAGNOSIS — Z79899 Other long term (current) drug therapy: Secondary | ICD-10-CM

## 2022-03-21 LAB — CBC WITH DIFFERENTIAL/PLATELET
Absolute Monocytes: 314 cells/uL (ref 200–950)
Basophils Absolute: 30 cells/uL (ref 0–200)
Basophils Relative: 0.7 %
Eosinophils Absolute: 163 cells/uL (ref 15–500)
Eosinophils Relative: 3.8 %
HCT: 41.7 % (ref 35.0–45.0)
Hemoglobin: 14.2 g/dL (ref 11.7–15.5)
Lymphs Abs: 1802 cells/uL (ref 850–3900)
MCH: 29.8 pg (ref 27.0–33.0)
MCHC: 34.1 g/dL (ref 32.0–36.0)
MCV: 87.4 fL (ref 80.0–100.0)
MPV: 11.3 fL (ref 7.5–12.5)
Monocytes Relative: 7.3 %
Neutro Abs: 1991 cells/uL (ref 1500–7800)
Neutrophils Relative %: 46.3 %
Platelets: 237 10*3/uL (ref 140–400)
RBC: 4.77 10*6/uL (ref 3.80–5.10)
RDW: 13.1 % (ref 11.0–15.0)
Total Lymphocyte: 41.9 %
WBC: 4.3 10*3/uL (ref 3.8–10.8)

## 2022-03-21 LAB — COMPLETE METABOLIC PANEL WITH GFR
AG Ratio: 1.8 (calc) (ref 1.0–2.5)
ALT: 11 U/L (ref 6–29)
AST: 19 U/L (ref 10–35)
Albumin: 4.8 g/dL (ref 3.6–5.1)
Alkaline phosphatase (APISO): 80 U/L (ref 37–153)
BUN/Creatinine Ratio: 11 (calc) (ref 6–22)
BUN: 12 mg/dL (ref 7–25)
CO2: 33 mmol/L — ABNORMAL HIGH (ref 20–32)
Calcium: 10.5 mg/dL — ABNORMAL HIGH (ref 8.6–10.4)
Chloride: 101 mmol/L (ref 98–110)
Creat: 1.09 mg/dL — ABNORMAL HIGH (ref 0.50–1.03)
Globulin: 2.7 g/dL (calc) (ref 1.9–3.7)
Glucose, Bld: 58 mg/dL — ABNORMAL LOW (ref 65–99)
Potassium: 3.3 mmol/L — ABNORMAL LOW (ref 3.5–5.3)
Sodium: 142 mmol/L (ref 135–146)
Total Bilirubin: 0.6 mg/dL (ref 0.2–1.2)
Total Protein: 7.5 g/dL (ref 6.1–8.1)
eGFR: 62 mL/min/{1.73_m2} (ref >=60)

## 2022-03-21 LAB — URINALYSIS, ROUTINE W REFLEX MICROSCOPIC
Bilirubin Urine: NEGATIVE
Glucose, UA: NEGATIVE
Hgb urine dipstick: NEGATIVE
Ketones, ur: NEGATIVE
Leukocytes,Ua: NEGATIVE
Nitrite: NEGATIVE
Protein, ur: NEGATIVE
Specific Gravity, Urine: 1.005 (ref 1.001–1.035)
pH: 6.5 (ref 5.0–8.0)

## 2022-03-21 LAB — MICROALBUMIN / CREATININE URINE RATIO
Creatinine, Urine: 33 mg/dL (ref 20–275)
Microalb Creat Ratio: 6 mcg/mg creat (ref <30)
Microalb, Ur: 0.2 mg/dL

## 2022-03-21 LAB — LIPID PANEL
Cholesterol: 209 mg/dL — ABNORMAL HIGH (ref <200)
HDL: 76 mg/dL (ref >=50)
LDL Cholesterol (Calc): 110 mg/dL (calc) — ABNORMAL HIGH
Non-HDL Cholesterol (Calc): 133 mg/dL (calc) — ABNORMAL HIGH (ref <130)
Total CHOL/HDL Ratio: 2.8 (calc) (ref <5.0)
Triglycerides: 120 mg/dL (ref <150)

## 2022-03-21 LAB — MAGNESIUM: Magnesium: 2.2 mg/dL (ref 1.5–2.5)

## 2022-03-21 LAB — VITAMIN D 25 HYDROXY (VIT D DEFICIENCY, FRACTURES): Vit D, 25-Hydroxy: 78 ng/mL (ref 30–100)

## 2022-03-21 LAB — HEMOGLOBIN A1C
Hgb A1c MFr Bld: 5.6 % of total Hgb (ref <5.7)
Mean Plasma Glucose: 114 mg/dL
eAG (mmol/L): 6.3 mmol/L

## 2022-03-21 LAB — TSH: TSH: 2.46 mIU/L

## 2022-03-21 NOTE — Patient Instructions (Addendum)
Good Evening [name],   Our office received a referral from [name], NP to schedule an appointment. If you will give our office a call at your convenience to discuss scheduling at [phone] option 1   Thank you, [facility]   [place]   Know what a healthy weight is for you (roughly BMI <25) and aim to maintain this   Aim for 7+ servings of fruits and vegetables daily   70-80+ fluid ounces of water or unsweet tea for healthy kidneys   Limit to max 1 drink of alcohol per day; avoid smoking/tobacco   Limit animal fats in diet for cholesterol and heart health - choose grass fed whenever available   Avoid highly processed foods, and foods high in saturated/trans fats   Aim for low stress - take time to unwind and care for your mental health   Aim for 150 min of moderate intensity exercise weekly for heart health, and weights twice weekly for bone health   Aim for 7-9 hours of sleep daily

## 2022-03-23 ENCOUNTER — Encounter: Payer: Self-pay | Admitting: Gastroenterology

## 2022-04-12 ENCOUNTER — Encounter: Payer: Self-pay | Admitting: Gastroenterology

## 2022-04-18 ENCOUNTER — Other Ambulatory Visit: Payer: Self-pay | Admitting: Nurse Practitioner

## 2022-04-18 ENCOUNTER — Telehealth: Payer: Self-pay | Admitting: Nurse Practitioner

## 2022-04-18 DIAGNOSIS — I10 Essential (primary) hypertension: Secondary | ICD-10-CM

## 2022-04-18 MED ORDER — HYDROCHLOROTHIAZIDE 25 MG PO TABS: ORAL_TABLET | ORAL | 3 refills | Status: DC

## 2022-04-18 NOTE — Telephone Encounter (Signed)
Patient is requesting a refill on HCTZ to [facility] on [address].

## 2022-05-18 ENCOUNTER — Ambulatory Visit (AMBULATORY_SURGERY_CENTER): Payer: Self-pay

## 2022-05-18 VITALS — Ht 68.5 in | Wt 177.0 lb

## 2022-05-18 DIAGNOSIS — Z1211 Encounter for screening for malignant neoplasm of colon: Secondary | ICD-10-CM

## 2022-05-18 MED ORDER — NA SULFATE-K SULFATE-MG SULF 17.5-3.13-1.6 GM/177ML PO SOLN: 1.0000 | Freq: Once | ORAL | 0 refills | Status: AC

## 2022-05-18 NOTE — Progress Notes (Signed)

## 2022-05-29 ENCOUNTER — Encounter: Payer: Self-pay | Admitting: Certified Registered Nurse Anesthetist

## 2022-06-02 ENCOUNTER — Encounter: Payer: Self-pay | Admitting: Gastroenterology

## 2022-06-06 ENCOUNTER — Encounter: Payer: Self-pay | Admitting: Gastroenterology

## 2022-06-06 ENCOUNTER — Ambulatory Visit: Payer: No Typology Code available for payment source | Admitting: Gastroenterology

## 2022-06-06 VITALS — BP 129/72 | HR 68 | Temp 98.9°F | Resp 12 | Ht 68.5 in | Wt 180.0 lb

## 2022-06-06 DIAGNOSIS — Z1211 Encounter for screening for malignant neoplasm of colon: Secondary | ICD-10-CM

## 2022-06-06 DIAGNOSIS — D122 Benign neoplasm of ascending colon: Secondary | ICD-10-CM

## 2022-06-06 MED ORDER — SODIUM CHLORIDE 0.9 % IV SOLN: 500.0000 mL | Freq: Once | INTRAVENOUS | Status: AC

## 2022-06-06 NOTE — Progress Notes (Signed)
Called to room to assist during endoscopic procedure.  Patient ID and intended procedure confirmed with present staff. Received instructions for my participation in the procedure from the performing physician.  

## 2022-06-06 NOTE — Progress Notes (Signed)
Pt's states no medical or surgical changes since previsit or office visit. 

## 2022-06-06 NOTE — Progress Notes (Signed)
History and Physical:  This patient presents for endoscopic testing for: Encounter Diagnosis  Name Primary?   Special screening for malignant neoplasms, colon Yes    Average risk - first screening exam. Patient denies chronic abdominal pain, rectal bleeding, constipation or diarrhea.   Patient is otherwise without complaints or active issues today.   Past Medical History: Past Medical History:  Diagnosis Date   Allergy    HTN (hypertension)      Past Surgical History: Past Surgical History:  Procedure Laterality Date   CESAREAN SECTION     FOOT SURGERY     L foot 2018, R foot 1999 - straightened toes. Dr. Jacqualyn Posey    Allergies: No Known Allergies  Outpatient Meds: Current Outpatient Medications  Medication Sig Dispense Refill   hydrochlorothiazide (HYDRODIURIL) 25 MG tablet Take 1 tab daily in the morning for blood pressure goal <130/80. 90 tablet 3   levonorgestrel (MIRENA) 20 MCG/24HR IUD 1 each by Intrauterine route once.     acyclovir (ZOVIRAX) 400 MG tablet Take 1 tab three times a day for viral rash for 5 days. Start as soon as symptoms appear. (Patient not taking: Reported on 03/21/2022) 30 tablet 0   Cholecalciferol 50 MCG (2000 UT) CHEW Chew 1 tablet by mouth daily.     ELDERBERRY PO Take by mouth daily.     metroNIDAZOLE (METROGEL) 0.75 % vaginal gel Place 1 Applicatorful vaginally at bedtime. 1 applicator at night for 5 days 70 g 1   Multiple Vitamin (MULTIVITAMIN) tablet Take 1 tablet by mouth daily.     Omega-3 Fatty Acids (FISH OIL OMEGA-3 PO) Take 1 capsule by mouth daily.     Current Facility-Administered Medications  Medication Dose Route Frequency Provider Last Rate Last Admin   0.9 %  sodium chloride infusion  500 mL Intravenous Once Danis, Estill Cotta III, MD          ___________________________________________________________________ Objective   Exam:  BP (!) 149/113   Pulse 79   Temp 98.9 F (37.2 C)   Ht 5' 8.5" (1.74 m)   Wt 180 lb  (81.6 kg)   SpO2 99%   BMI 26.97 kg/m   CV: regular , S1/S2 Resp: clear to auscultation bilaterally, normal RR and effort noted GI: soft, no tenderness, with active bowel sounds.   Assessment: Encounter Diagnosis  Name Primary?   Special screening for malignant neoplasms, colon Yes     Plan: Colonoscopy  The benefits and risks of the planned procedure were described in detail with the patient or (when appropriate) their health care proxy.  Risks were outlined as including, but not limited to, bleeding, infection, perforation, adverse medication reaction leading to cardiac or pulmonary decompensation, pancreatitis (if ERCP).  The limitation of incomplete mucosal visualization was also discussed.  No guarantees or warranties were given.    The patient is appropriate for an endoscopic procedure in the ambulatory setting.   - Wilfrid Lund, MD

## 2022-06-06 NOTE — Op Note (Signed)
[facility] Patient Name: [name] Procedure Date: 06/06/2022 8:26 AM MRN: [id number] Endoscopist: [place]. Loletha Carrow , MD, [id number] Age: 52 Referring MD:  Date of Birth: 1970-08-20 Gender: Female Account #: [id number] Procedure:                Colonoscopy Indications:              Screening for colorectal malignant neoplasm, This                            is the patient's first colonoscopy Medicines:                Monitored Anesthesia Care Procedure:                Pre-Anesthesia Assessment:                           - Prior to the procedure, a History and Physical                            was performed, and patient medications and                            allergies were reviewed. The patient's tolerance of                            previous anesthesia was also reviewed. The risks                            and benefits of the procedure and the sedation                            options and risks were discussed with the patient.                            All questions were answered, and informed consent                            was obtained. Prior Anticoagulants: The patient has                            taken no anticoagulant or antiplatelet agents. ASA                            Grade Assessment: II - A patient with mild systemic                            disease. After reviewing the risks and benefits,                            the patient was deemed in satisfactory condition to                            undergo the procedure.  After obtaining informed consent, the colonoscope                            was passed under direct vision. Throughout the                            procedure, the patient's blood pressure, pulse, and                            oxygen saturations were monitored continuously. The                            CF [id number] #[id number] was introduced through the anus                            and advanced to the  the cecum, identified by                            appendiceal orifice and ileocecal valve. The                            colonoscopy was somewhat difficult due to a                            redundant colon. Successful completion of the                            procedure was aided by using manual pressure and                            straightening and shortening the scope to obtain                            bowel loop reduction. The patient tolerated the                            procedure well. The quality of the bowel                            preparation was good. The ileocecal valve,                            appendiceal orifice, and rectum were photographed.                            The bowel preparation used was SUPREP via split                            dose instruction. Scope In: 8:35:58 AM Scope Out: 8:54:08 AM Scope Withdrawal Time: 0 hours 13 minutes 47 seconds  Total Procedure Duration: 0 hours 18 minutes 10 seconds  Findings:                 The perianal and digital rectal examinations were  normal.                           Repeat examination of right colon under NBI                            performed.                           A few diverticula were found in the right colon.                           A 5 mm polyp was found in the distal ascending                            colon. The polyp was sessile. The polyp was removed                            with a cold snare. Resection and retrieval were                            complete.                           The exam was otherwise without abnormality on                            direct and retroflexion views. Complications:            No immediate complications. Estimated Blood Loss:     Estimated blood loss was minimal. Impression:               - Diverticulosis in the right colon.                           - One 5 mm polyp in the ascending colon, removed                             with a cold snare. Resected and retrieved.                           - The examination was otherwise normal on direct                            and retroflexion views. Recommendation:           - Patient has a contact number available for                            emergencies. The signs and symptoms of potential                            delayed complications were discussed with the                            patient. Return to normal activities tomorrow.  Written discharge instructions were provided to the                            patient.                           - Resume previous diet.                           - Continue present medications.                           - Await pathology results.                           - Repeat colonoscopy is recommended for                            surveillance. The colonoscopy date will be                            determined after pathology results from today's                            exam become available for review. [name] L. Loletha Carrow, MD 06/06/2022 8:58:15 AM This report has been signed electronically.

## 2022-06-06 NOTE — Progress Notes (Signed)
Report given to PACU, vss 

## 2022-06-06 NOTE — Patient Instructions (Signed)
YOU HAD AN ENDOSCOPIC PROCEDURE TODAY AT [facility]:   Refer to the procedure report that was given to you for any specific questions about what was found during the examination.  If the procedure report does not answer your questions, please call your gastroenterologist to clarify.  If you requested that your care partner not be given the details of your procedure findings, then the procedure report has been included in a sealed envelope for you to review at your convenience later.  YOU SHOULD EXPECT: Some feelings of bloating in the abdomen. Passage of more gas than usual.  Walking can help get rid of the air that was put into your GI tract during the procedure and reduce the bloating. If you had a lower endoscopy (such as a colonoscopy or flexible sigmoidoscopy) you may notice spotting of blood in your stool or on the toilet paper. If you underwent a bowel prep for your procedure, you may not have a normal bowel movement for a few days.  Please Note:  You might notice some irritation and congestion in your nose or some drainage.  This is from the oxygen used during your procedure.  There is no need for concern and it should clear up in a day or so.  SYMPTOMS TO REPORT IMMEDIATELY:  Following lower endoscopy (colonoscopy or flexible sigmoidoscopy):  Excessive amounts of blood in the stool  Significant tenderness or worsening of abdominal pains  Swelling of the abdomen that is new, acute  Fever of 100F or higher  For urgent or emergent issues, a gastroenterologist can be reached at any hour by calling [phone]. Do not use MyChart messaging for urgent concerns.    DIET:  We do recommend a small meal at first, but then you may proceed to your regular diet.  Drink plenty of fluids but you should avoid alcoholic beverages for 24 hours.   MEDICATIONS: Continue present medications.   Please see handouts given to you by your recovery nurse: Polyps, diverticulosis.  FOLLOW  UP: Repeat colonoscopy is recommended for surveillance. The colonoscopy date will be determined after pathology results from today's exam become available for review.  Thank you for allowing [place] to provide for your healthcare needs today. ACTIVITY:  You should plan to take it easy for the rest of today and you should NOT DRIVE or use heavy machinery until tomorrow (because of the sedation medicines used during the test).    FOLLOW UP: Our staff will call the number listed on your records the next business day following your procedure.  We will call around 7:15- 8:00 am to check on you and address any questions or concerns that you may have regarding the information given to you following your procedure. If we do not reach you, we will leave a message.     If any biopsies were taken you will be contacted by phone or by letter within the next 1-3 weeks.  Please call us at [phone] if you have not heard about the biopsies in 3 weeks.    SIGNATURES/CONFIDENTIALITY: You and/or your care partner have signed paperwork which will be entered into your electronic medical record.  These signatures attest to the fact that that the information above on your After Visit Summary has been reviewed and is understood.  Full responsibility of the confidentiality of this discharge information lies with you and/or your care-partner.

## 2022-06-07 ENCOUNTER — Telehealth: Payer: Self-pay

## 2022-06-07 NOTE — Telephone Encounter (Signed)
  Follow up Call-     06/06/2022    8:00 AM  Call back number  Post procedure Call Back phone  # [phone]  Permission to leave phone message Yes     Patient questions:  Do you have a fever, pain , or abdominal swelling? No. Pain Score  0 *  Have you tolerated food without any problems? Yes.    Have you been able to return to your normal activities? Yes.    Do you have any questions about your discharge instructions: Diet   No. Medications  No. Follow up visit  No.  Do you have questions or concerns about your Care? No.  Actions: * If pain score is 4 or above: No action needed, pain <4.

## 2022-06-10 ENCOUNTER — Encounter: Payer: Self-pay | Admitting: Gastroenterology

## 2022-07-04 ENCOUNTER — Telehealth: Payer: Self-pay | Admitting: Nurse Practitioner

## 2022-07-04 ENCOUNTER — Other Ambulatory Visit: Payer: Self-pay | Admitting: Nurse Practitioner

## 2022-07-04 MED ORDER — TRAZODONE HCL 50 MG PO TABS: ORAL_TABLET | ORAL | 0 refills | Status: AC

## 2022-07-04 NOTE — Telephone Encounter (Signed)
Patient is requesting a refill on Trazadone 50 mg (25 mg. Isn't strong enough)

## 2022-07-04 NOTE — Telephone Encounter (Signed)
Done

## 2022-12-19 ENCOUNTER — Ambulatory Visit: Payer: No Typology Code available for payment source | Admitting: Nurse Practitioner

## 2022-12-19 ENCOUNTER — Telehealth: Payer: Self-pay | Admitting: Nurse Practitioner

## 2022-12-19 ENCOUNTER — Ambulatory Visit
Admission: RE | Admit: 2022-12-19 | Discharge: 2022-12-19 | Disposition: A | Discharge status: Home / Self Care | Payer: No Typology Code available for payment source | Source: Ambulatory Visit | Attending: Internal Medicine | Admitting: Internal Medicine

## 2022-12-19 VITALS — BP 149/102 | HR 79 | Temp 98.3°F | Resp 18

## 2022-12-19 DIAGNOSIS — S0501XA Injury of conjunctiva and corneal abrasion without foreign body, right eye, initial encounter: Secondary | ICD-10-CM

## 2022-12-19 DIAGNOSIS — L03213 Periorbital cellulitis: Secondary | ICD-10-CM

## 2022-12-19 MED ORDER — TOBRAMYCIN 0.3 % OP SOLN: 1.0000 [drp] | OPHTHALMIC | 0 refills | Status: DC

## 2022-12-19 MED ORDER — CEFDINIR 300 MG PO CAPS: 300.0000 mg | ORAL_CAPSULE | Freq: Two times a day (BID) | ORAL | 0 refills | Status: DC

## 2022-12-19 NOTE — ED Provider Notes (Signed)
[facility]  Note:  This document was prepared using Conservation officer, historic buildings and may include unintentional dictation errors.  MRN: [id number] DOB: June 20, 1970  Subjective:   [name] is a 52 y.o. female presenting for 1 day history of persistent watering and irritation, redness of the right eye.  She noticed that she has swelling of her right eye since this morning.  She did go to a water park prior to her symptoms starting.  Cannot recall any particular inciting event.  She does wear contact lenses but has not since her eyes started getting irritated.   Current Facility-Administered Medications:    0.9 %  sodium chloride infusion, 500 mL, Intravenous, Once, Danis, Starr Lake III, MD  Current Outpatient Medications:    acyclovir (ZOVIRAX) 400 MG tablet, Take 1 tab three times a day for viral rash for 5 days. Start as soon as symptoms appear. (Patient not taking: Reported on 03/21/2022), Disp: 30 tablet, Rfl: 0   Cholecalciferol 50 MCG (2000 UT) CHEW, Chew 1 tablet by mouth daily., Disp: , Rfl:    ELDERBERRY PO, Take by mouth daily., Disp: , Rfl:    hydrochlorothiazide (HYDRODIURIL) 25 MG tablet, Take 1 tab daily in the morning for blood pressure goal <130/80., Disp: 90 tablet, Rfl: 3   levonorgestrel (MIRENA) 20 MCG/24HR IUD, 1 each by Intrauterine route once., Disp: , Rfl:    metroNIDAZOLE (METROGEL) 0.75 % vaginal gel, Place 1 Applicatorful vaginally at bedtime. 1 applicator at night for 5 days, Disp: 70 g, Rfl: 1   Multiple Vitamin (MULTIVITAMIN) tablet, Take 1 tablet by mouth daily., Disp: , Rfl:    Omega-3 Fatty Acids (FISH OIL OMEGA-3 PO), Take 1 capsule by mouth daily., Disp: , Rfl:    traZODone (DESYREL) 50 MG tablet, 1/2-1 tablet for sleep, Disp: 90 tablet, Rfl: 0   No Known Allergies  Past Medical History:  Diagnosis Date   Allergy    HTN (hypertension)      Past Surgical History:  Procedure Laterality Date   CESAREAN SECTION      FOOT SURGERY     L foot 2018, R foot 1999 - straightened toes. Dr. Ardelle Anton    Family History  Problem Relation Age of Onset   Diabetes Father    Hypertension Father    Hyperlipidemia Father    Lymphoma Maternal Aunt    Kidney disease Maternal Uncle        CKD s/p transplant, ? born with 1 kidney    Cancer Maternal Grandmother 72       Pancreatic   Stroke Maternal Grandfather    Hypertension Maternal Grandfather    Dementia Maternal Grandfather    Hypertension Paternal Grandmother    Dementia Paternal Grandmother    Diabetes Paternal Grandfather    Colon cancer Neg Hx    Colon polyps Neg Hx    Esophageal cancer Neg Hx    Rectal cancer Neg Hx    Stomach cancer Neg Hx     Social History   Tobacco Use   Smoking status: Never   Smokeless tobacco: Never  Vaping Use   Vaping status: Never Used  Substance Use Topics   Alcohol use: Yes    Alcohol/week: 0.0 standard drinks of alcohol    Comment: socially   Drug use: No    ROS   Objective:   Vitals: BP (!) 149/102 (BP Location: Left Arm)   Pulse 79   Temp 98.3 F (36.8 C) (Oral)  Resp 18   SpO2 97%   Physical Exam Constitutional:      General: She is not in acute distress.    Appearance: Normal appearance. She is well-developed. She is not ill-appearing, toxic-appearing or diaphoretic.  HENT:     Head: Normocephalic and atraumatic.     Nose: Nose normal.     Mouth/Throat:     Mouth: Mucous membranes are moist.  Eyes:     General: Lids are everted, no foreign bodies appreciated. Vision grossly intact. No scleral icterus.       Right eye: Discharge (clear, watery) present. No foreign body or hordeolum.        Left eye: No foreign body, discharge or hordeolum.     Extraocular Movements: Extraocular movements intact.     Right eye: Normal extraocular motion.     Left eye: Normal extraocular motion and no nystagmus.     Conjunctiva/sclera:     Right eye: Right conjunctiva is injected. No chemosis, exudate or  hemorrhage.    Left eye: Left conjunctiva is not injected. No chemosis, exudate or hemorrhage.  Cardiovascular:     Rate and Rhythm: Normal rate.  Pulmonary:     Effort: Pulmonary effort is normal.  Skin:    General: Skin is warm and dry.  Neurological:     General: No focal deficit present.     Mental Status: She is alert and oriented to person, place, and time.  Psychiatric:        Mood and Affect: Mood normal.        Behavior: Behavior normal.    Eye Exam: Eyelids everted and swept for foreign body. The eye was anesthetized with 2 drops of tetracaine and stained with fluorescein. Examination under woods lamp does reveal area of increased stain uptake as outlined on the diagram. The eye was then irrigated copiously with saline.  Assessment and Plan :   PDMP not reviewed this encounter.  1. Abrasion of right cornea, initial encounter   2. Preseptal cellulitis of right upper eyelid    Suspect a corneal abrasion possibly from a contact lens use or an inadvertent injury while at the water park.  It has complicated to preseptal cellulitis.  Therefore will use both oral and topical therapy in cefdinir and tobramycin eyedrops.  Creatinine clearance calculated at 80 mL/min.  Follow-up urgently with the ophthalmologist on-call, Dr. Sherryll Burger. Counseled patient on potential for adverse effects with medications prescribed/recommended today, ER and return-to-clinic precautions discussed, patient verbalized understanding.    Wallis Bamberg, PA-C 12/20/22 1029

## 2022-12-19 NOTE — Discharge Instructions (Addendum)
Placed are taken tobramycin eyedrops infection of the right eye from a corneal scratch.  Use the oral antibiotic cefdinir to address infection of the upper eyelid which I think is also related.  Make sure you follow-up with Dr. Sherryll Burger as soon as possible.  He is the ophthalmologist on-call with [facility].  Call them today to make an appointment.

## 2022-12-19 NOTE — ED Triage Notes (Signed)
Pt c/o redness/watering to right eye started yesterday-swelling to top lid x today-denies injury-NAD-steady gait

## 2022-12-19 NOTE — Telephone Encounter (Signed)
Patient had acute visit with eye doctor, eye doctor mentioned issue with kidney. She would like to discuss

## 2022-12-19 NOTE — Telephone Encounter (Signed)
Spoke with patient and explained her past labs from 2021 and 2022 where she had stage 2 kidney function. Told patient she needs to push fluids, 60-80+ oz of water daily. Lab results were going to MyChart and she doesn't use it so she said to deactivate it. I deactivated it right then.

## 2022-12-19 NOTE — Telephone Encounter (Signed)
Please call patient to see if she wants to schedule appointment or if she has questions

## 2022-12-19 NOTE — Progress Notes (Deleted)
Assessment and Plan:  There are no diagnoses linked to this encounter.    Further disposition pending results of labs. Discussed med's effects and SE's.   Over 30 minutes of exam, counseling, chart review, and critical decision making was performed.   Future Appointments  Date Time Provider Department Center  12/19/2022  9:30 AM [facility] PROVIDER [facility] None  12/19/2022  2:00 PM Raynelle Dick, NP [facility] None  03/22/2023 10:00 AM Raynelle Dick, NP [facility] None    ------------------------------------------------------------------------------------------------------------------   HPI There were no vitals taken for this visit. 52 y.o.female presents for  Past Medical History:  Diagnosis Date   Allergy    HTN (hypertension)      No Known Allergies  Current Outpatient Medications on File Prior to Visit  Medication Sig   acyclovir (ZOVIRAX) 400 MG tablet Take 1 tab three times a day for viral rash for 5 days. Start as soon as symptoms appear. (Patient not taking: Reported on 03/21/2022)   Cholecalciferol 50 MCG (2000 UT) CHEW Chew 1 tablet by mouth daily.   ELDERBERRY PO Take by mouth daily.   hydrochlorothiazide (HYDRODIURIL) 25 MG tablet Take 1 tab daily in the morning for blood pressure goal <130/80.   levonorgestrel (MIRENA) 20 MCG/24HR IUD 1 each by Intrauterine route once.   metroNIDAZOLE (METROGEL) 0.75 % vaginal gel Place 1 Applicatorful vaginally at bedtime. 1 applicator at night for 5 days   Multiple Vitamin (MULTIVITAMIN) tablet Take 1 tablet by mouth daily.   Omega-3 Fatty Acids (FISH OIL OMEGA-3 PO) Take 1 capsule by mouth daily.   traZODone (DESYREL) 50 MG tablet 1/2-1 tablet for sleep   Current Facility-Administered Medications on File Prior to Visit  Medication   0.9 %  sodium chloride infusion    ROS: all negative except above.   Physical Exam:  There were no vitals taken for this visit.  General Appearance: Well nourished, in no apparent  distress. Eyes: PERRLA, EOMs, conjunctiva no swelling or erythema Sinuses: No Frontal/maxillary tenderness ENT/Mouth: Ext aud canals clear, TMs without erythema, bulging. No erythema, swelling, or exudate on post pharynx.  Tonsils not swollen or erythematous. Hearing normal.  Neck: Supple, thyroid normal.  Respiratory: Respiratory effort normal, BS equal bilaterally without rales, rhonchi, wheezing or stridor.  Cardio: RRR with no MRGs. Brisk peripheral pulses without edema.  Abdomen: Soft, + BS.  Non tender, no guarding, rebound, hernias, masses. Lymphatics: Non tender without lymphadenopathy.  Musculoskeletal: Full ROM, 5/5 strength, normal gait.  Skin: Warm, dry without rashes, lesions, ecchymosis.  Neuro: Cranial nerves intact. Normal muscle tone, no cerebellar symptoms. Sensation intact.  Psych: Awake and oriented X 3, normal affect, Insight and Judgment appropriate.     Raynelle Dick, NP 8:57 AM [facility]

## 2023-03-21 NOTE — Progress Notes (Unsigned)
 Complete Physical  Assessment and Plan:  Encounter for General Medical Examination with abnormal findings 1 year Follows with GYN annually  Mammogram 01/11/23 Pap UTD 10/29/21 Colonoscopy 06/06/22- adenomatous polyp repeat due in 7 years 2031  Hypertension Continue HCTZ 25 mg daily Discussed persistent diastolic elevation; she admits to excess sodium intake, will reduce intake and start monitoring at home Monitor blood pressure at home; call if consistently over 130/80 Continue DASH diet.   Reminder to go to the ER if any CP, SOB, nausea, dizziness, severe HA, changes vision/speech, left arm numbness and tingling and jaw pain. - CBC -CMP/GFR - Magnesium - TSH - Urinalysis - EKG  Mixed hyperlipidemia Continue low cholesterol diet and exercise.  Check lipid panel.  - Lipid panel - TSH  CKD stage 2 Increase fluids, avoid NSAIDS, monitor sugars, will monitor  History of adenomatous polyp of colon Colonoscopy UTD Follow up due 2031 with Dr. Myrtie Neither  Hypokalemia - CMP  Hypercalcemia Avoid TUMS and calcium supplements - CMP - PTH  Insomnia - good sleep hygiene discussed, increase day time activity - some benefit with trazodone but patient prefers to avoid meds, monitor for now, consider belsomra due to difficulty staying asleep   Screening for blood or protein in urine - Urinalysis, Routine w reflex microscopic (not at [facility]) - Microalbumin/creatinine urine ratio  Screening for diabetes mellitus - Hemoglobin A1c  Vitamin D deficiency Continue supplement - VITAMIN D 25 Hydroxy (Vit-D Deficiency, Fractures)  Screening for ischemic heart disease -     EKG 12-Lead  Screening for AAA - U/S ABD Retroperitoneal LTD  Screening for thyroid disorder - TSH  Need for influenza vaccine Fluzone trivalent flu vaccine given  Medication management -     CBC with Differential/Platelet -     COMPLETE METABOLIC PANEL WITH GFR -     Magnesium -     Lipid panel -     TSH -      Hemoglobin A1C w/out eAG -     VITAMIN D 25 Hydroxy (Vit-D Deficiency, Fractures) -     EKG 12-Lead -     [place], RETROPERITNL ABD,  LTD -     Urinalysis, Routine w reflex microscopic -     Microalbumin / creatinine urine ratio  Flu vaccine need -     Fluzone Trivalent Flu Vaccine ([place]uli dose preparattion)      Discussed med's effects and SE's. Screening labs and tests as requested with regular follow-up as recommended. Over 40 minutes of exam, counseling, chart review and critical decision making was performed  Future Appointments  Date Time Provider Department Center  03/21/2024 10:00 AM Raynelle Dick, NP [facility] None     HPI  This very nice 52 y.o.female presents for complete physical. She has Environmental allergies; Mild hyperlipidemia; Overweight (BMI 25.0-29.9); Vitamin D deficiency; Hypertension; CKD (chronic kidney disease) stage 2, GFR 60-89 ml/min; Hypercalcemia; HSV-2 seropositive; and HSV infection on their problem list.  She has long term partner, has daughter who withdrew from school and living with her. Daughter is having a lot of issues with her mental health - she is working at [employer]  Patient reports no complaints at this time.     She [facility]YN[facility], Jamie, NP, she is post menopausal, has progesterone IUD. She was started on estrogen but had BP elevations, has been d/c'd. Was having some dyspareunia, but reports has been managing well with coconut oil. Mammogram and PAP are up to date.  She has intermittent trouble staying asleep, has tried trazodone which helped some, manages ok, improves when less stressed, doesn't want to take meds.   BMI is Body mass index is 26.7 kg/m., she has not been working on diet and exercise . Has started part time job at [employer] and body works.   Trying to get in 4 bottles of water. May have 1 soda per day instead of coffee.  Wt Readings from Last 3 Encounters:  03/22/23 178 lb 3.2 oz (80.8 kg)   06/06/22 180 lb (81.6 kg)  05/18/22 177 lb (80.3 kg)   BP is currently well controlled with HCTZ 25 mg QD, today their BP is BP: 128/84,  BP Readings from Last 3 Encounters:  03/22/23 128/84  12/19/22 (!) 149/102  06/06/22 129/72  She does not workout. She denies shortness of breath, dizziness, headaches and chest pain.   She is not on cholesterol medication and denies myalgias. Her cholesterol is not at goal.She has been limiting red meat, fried foods.  The cholesterol last visit was:   Lab Results  Component Value Date   CHOL 209 (H) 03/21/2022   HDL 76 03/21/2022   LDLCALC 110 (H) 03/21/2022   TRIG 120 03/21/2022   CHOLHDL 2.8 03/21/2022   Last G2X in the office was:  Lab Results  Component Value Date   HGBA1C 5.6 03/21/2022   Last GFR:  Lab Results  Component Value Date   EGFR 62 03/21/2022    Patient is on Vitamin D supplement, she has MVIT with 800 and will take an additional 2000 2 a day.    Lab Results  Component Value Date   VD25OH 78 03/21/2022        Current Medications:  Current Outpatient Medications on File Prior to Visit  Medication Sig Dispense Refill   Cholecalciferol 50 MCG (2000 UT) CHEW Chew 1 tablet by mouth daily.     ELDERBERRY PO Take by mouth daily.     hydrochlorothiazide (HYDRODIURIL) 25 MG tablet Take 1 tab daily in the morning for blood pressure goal <130/80. 90 tablet 3   levonorgestrel (MIRENA) 20 MCG/24HR IUD 1 each by Intrauterine route once.     metroNIDAZOLE (METROGEL) 0.75 % vaginal gel Place 1 Applicatorful vaginally at bedtime. 1 applicator at night for 5 days 70 g 1   Multiple Vitamin (MULTIVITAMIN) tablet Take 1 tablet by mouth daily.     traZODone (DESYREL) 50 MG tablet 1/2-1 tablet for sleep 90 tablet 0   acyclovir (ZOVIRAX) 400 MG tablet Take 1 tab three times a day for viral rash for 5 days. Start as soon as symptoms appear. (Patient not taking: Reported on 03/21/2022) 30 tablet 0   cefdinir (OMNICEF) 300 MG capsule Take 1  capsule (300 mg total) by mouth 2 (two) times daily. (Patient not taking: Reported on 03/22/2023) 20 capsule 0   Omega-3 Fatty Acids (FISH OIL OMEGA-3 PO) Take 1 capsule by mouth daily. (Patient not taking: Reported on 03/22/2023)     tobramycin (TOBREX) 0.3 % ophthalmic solution Place 1 drop into the right eye every 4 (four) hours. (Patient not taking: Reported on 03/22/2023) 5 mL 0   Current Facility-Administered Medications on File Prior to Visit  Medication Dose Route Frequency Provider Last Rate Last Admin   0.9 %  sodium chloride infusion  500 mL Intravenous Once Danis, Andreas Blower, MD        Immunization History  Administered Date(s) Administered   Influenza Inj Mdck Quad With Preservative  03/14/2019, 03/16/2020   Influenza,inj,Quad PF,6+ Mos 03/17/2021, 03/21/2022   PFIZER(Purple Top)SARS-COV-2 Vaccination 09/07/2019, 09/28/2019   Tdap 03/01/2018    Health Maintenance  Topic Date Due   Zoster Vaccines- Shingrix (1 of 2) Never done   Cervical Cancer Screening (HPV/Pap Cotest)  11/02/2017   COVID-19 Vaccine (3 - Pfizer risk series) 10/26/2019   MAMMOGRAM  12/20/2021   DTaP/Tdap/Td (2 - Td or Tdap) 03/01/2028   Colonoscopy  06/06/2029   HIV Screening  Completed   HPV VACCINES  Aged Out   INFLUENZA VACCINE  Discontinued   Hepatitis C Screening  Discontinued     Last eye exam: Hoyt Koch, last 10/2019, wears glasses Last dental exam: last 2021  Medical History:  Past Medical History:  Diagnosis Date   Allergy    HTN (hypertension)    Allergies No Known Allergies  SURGICAL HISTORY She  has a past surgical history that includes Cesarean section and Foot surgery. FAMILY HISTORY Her family history includes Cancer (age of onset: 69) in her maternal grandmother; Dementia in her maternal grandfather and paternal grandmother; Diabetes in her father and paternal grandfather; Hyperlipidemia in her father; Hypertension in her father, maternal grandfather, and paternal  grandmother; Kidney disease in her maternal uncle; Lymphoma in her maternal aunt; Stroke in her maternal grandfather. SOCIAL HISTORY She  reports that she has never smoked. She has never used smokeless tobacco. She reports current alcohol use. She reports that she does not use drugs.  Review of Systems: Review of Systems  Constitutional:  Negative for malaise/fatigue and weight loss.  HENT:  Negative for hearing loss and tinnitus.   Eyes:  Negative for blurred vision and double vision.  Respiratory:  Negative for cough, shortness of breath and wheezing.   Cardiovascular:  Negative for chest pain, palpitations, orthopnea, claudication and leg swelling.  Gastrointestinal:  Negative for abdominal pain, blood in stool, constipation, diarrhea, heartburn, melena, nausea and vomiting.  Genitourinary: Negative.   Musculoskeletal:  Negative for joint pain and myalgias.  Skin:  Negative for rash.  Neurological:  Negative for dizziness, tingling, sensory change, weakness and headaches.  Endo/Heme/Allergies:  Negative for polydipsia.  Psychiatric/Behavioral:  The patient has insomnia.   All other systems reviewed and are negative.   Physical Exam: Estimated body mass index is 26.7 kg/m as calculated from the following:   Height as of this encounter: 5' 8.5" (1.74 m).   Weight as of this encounter: 178 lb 3.2 oz (80.8 kg). BP 128/84   Pulse 84   Temp (!) 97.5 F (36.4 C)   Ht 5' 8.5" (1.74 m)   Wt 178 lb 3.2 oz (80.8 kg)   SpO2 98%   BMI 26.70 kg/m  General Appearance: Well nourished, well dressed AA female in no apparent distress.  Eyes: PERRLA, EOMs, conjunctiva no swelling or erythema Sinuses: No Frontal/maxillary tenderness  ENT/Mouth: Ext aud canals clear, normal light reflex with TMs without erythema, bulging. Good dentition. No erythema, swelling, or exudate on post pharynx. Hearing normal.  Neck: Supple, thyroid normal. No bruits  Respiratory: Respiratory effort normal, BS equal  bilaterally without rales, rhonchi, wheezing or stridor.  Cardio: RRR without murmurs, rubs or gallops. Brisk peripheral pulses without edema.  Chest: symmetric, with normal excursions and percussion.  Breasts: Defer to GYN Abdomen: Soft, nontender, no guarding, rebound, hernias, masses, or organomegaly.  Lymphatics: Non tender without lymphadenopathy.  Genitourinary: defer to GYN Musculoskeletal: Full ROM all peripheral extremities,5/5 strength, and normal gait.  Skin:  Warm, dry without rashes,  lesions, ecchymosis. Neuro: Cranial nerves intact, reflexes equal bilaterally. Normal muscle tone, no cerebellar symptoms. Sensation intact.  Psych: Awake and oriented X 3, normal affect, Insight and Judgment appropriate.   EKG: NSR, no ST changes AAA:  < 3 cm   Conny Situ E  10:07 AM [facility]

## 2023-03-22 ENCOUNTER — Ambulatory Visit (INDEPENDENT_AMBULATORY_CARE_PROVIDER_SITE_OTHER): Payer: No Typology Code available for payment source | Admitting: Nurse Practitioner

## 2023-03-22 ENCOUNTER — Encounter: Payer: Self-pay | Admitting: Nurse Practitioner

## 2023-03-22 VITALS — BP 128/84 | HR 84 | Temp 97.5°F | Ht 68.5 in | Wt 178.2 lb

## 2023-03-22 DIAGNOSIS — Z79899 Other long term (current) drug therapy: Secondary | ICD-10-CM

## 2023-03-22 DIAGNOSIS — Z0000 Encounter for general adult medical examination without abnormal findings: Secondary | ICD-10-CM

## 2023-03-22 DIAGNOSIS — E785 Hyperlipidemia, unspecified: Secondary | ICD-10-CM

## 2023-03-22 DIAGNOSIS — Z136 Encounter for screening for cardiovascular disorders: Secondary | ICD-10-CM

## 2023-03-22 DIAGNOSIS — Z131 Encounter for screening for diabetes mellitus: Secondary | ICD-10-CM

## 2023-03-22 DIAGNOSIS — E782 Mixed hyperlipidemia: Secondary | ICD-10-CM

## 2023-03-22 DIAGNOSIS — G4700 Insomnia, unspecified: Secondary | ICD-10-CM

## 2023-03-22 DIAGNOSIS — Z23 Encounter for immunization: Secondary | ICD-10-CM | POA: Diagnosis not present

## 2023-03-22 DIAGNOSIS — Z1329 Encounter for screening for other suspected endocrine disorder: Secondary | ICD-10-CM

## 2023-03-22 DIAGNOSIS — Z1389 Encounter for screening for other disorder: Secondary | ICD-10-CM

## 2023-03-22 DIAGNOSIS — E663 Overweight: Secondary | ICD-10-CM

## 2023-03-22 DIAGNOSIS — Z860101 Personal history of adenomatous and serrated colon polyps: Secondary | ICD-10-CM

## 2023-03-22 DIAGNOSIS — I700 Atherosclerosis of aorta: Secondary | ICD-10-CM | POA: Diagnosis not present

## 2023-03-22 DIAGNOSIS — Z0001 Encounter for general adult medical examination with abnormal findings: Secondary | ICD-10-CM

## 2023-03-22 DIAGNOSIS — E559 Vitamin D deficiency, unspecified: Secondary | ICD-10-CM

## 2023-03-22 DIAGNOSIS — I10 Essential (primary) hypertension: Secondary | ICD-10-CM

## 2023-03-22 DIAGNOSIS — N182 Chronic kidney disease, stage 2 (mild): Secondary | ICD-10-CM

## 2023-03-22 NOTE — Patient Instructions (Signed)

## 2023-03-23 ENCOUNTER — Other Ambulatory Visit: Payer: Self-pay | Admitting: Nurse Practitioner

## 2023-03-23 DIAGNOSIS — E876 Hypokalemia: Secondary | ICD-10-CM

## 2023-03-23 LAB — URINALYSIS, ROUTINE W REFLEX MICROSCOPIC
Bilirubin Urine: NEGATIVE
Glucose, UA: NEGATIVE
Hgb urine dipstick: NEGATIVE
Ketones, ur: NEGATIVE
Leukocytes,Ua: NEGATIVE
Nitrite: NEGATIVE
Protein, ur: NEGATIVE
Specific Gravity, Urine: 1.008 (ref 1.001–1.035)
pH: 6 (ref 5.0–8.0)

## 2023-03-23 LAB — TSH: TSH: 2.31 m[IU]/L

## 2023-03-23 LAB — COMPLETE METABOLIC PANEL WITH GFR
AG Ratio: 1.7 (calc) (ref 1.0–2.5)
ALT: 13 U/L (ref 6–29)
AST: 18 U/L (ref 10–35)
Albumin: 4.8 g/dL (ref 3.6–5.1)
Alkaline phosphatase (APISO): 88 U/L (ref 37–153)
BUN/Creatinine Ratio: 13 (calc) (ref 6–22)
BUN: 14 mg/dL (ref 7–25)
CO2: 34 mmol/L — ABNORMAL HIGH (ref 20–32)
Calcium: 11 mg/dL — ABNORMAL HIGH (ref 8.6–10.4)
Chloride: 103 mmol/L (ref 98–110)
Creat: 1.08 mg/dL — ABNORMAL HIGH (ref 0.50–1.03)
Globulin: 2.8 g/dL (ref 1.9–3.7)
Glucose, Bld: 60 mg/dL — ABNORMAL LOW (ref 65–99)
Potassium: 3.8 mmol/L (ref 3.5–5.3)
Sodium: 144 mmol/L (ref 135–146)
Total Bilirubin: 0.4 mg/dL (ref 0.2–1.2)
Total Protein: 7.6 g/dL (ref 6.1–8.1)
eGFR: 62 mL/min/{1.73_m2} (ref >=60)

## 2023-03-23 LAB — CBC WITH DIFFERENTIAL/PLATELET
Absolute Lymphocytes: 1593 {cells}/uL (ref 850–3900)
Absolute Monocytes: 383 {cells}/uL (ref 200–950)
Basophils Absolute: 9 {cells}/uL (ref 0–200)
Basophils Relative: 0.2 %
Eosinophils Absolute: 202 {cells}/uL (ref 15–500)
Eosinophils Relative: 4.6 %
HCT: 42.9 % (ref 35.0–45.0)
Hemoglobin: 14.2 g/dL (ref 11.7–15.5)
MCH: 29.3 pg (ref 27.0–33.0)
MCHC: 33.1 g/dL (ref 32.0–36.0)
MCV: 88.5 fL (ref 80.0–100.0)
MPV: 11.1 fL (ref 7.5–12.5)
Monocytes Relative: 8.7 %
Neutro Abs: 2213 {cells}/uL (ref 1500–7800)
Neutrophils Relative %: 50.3 %
Platelets: 262 10*3/uL (ref 140–400)
RBC: 4.85 10*6/uL (ref 3.80–5.10)
RDW: 13.2 % (ref 11.0–15.0)
Total Lymphocyte: 36.2 %
WBC: 4.4 10*3/uL (ref 3.8–10.8)

## 2023-03-23 LAB — MICROALBUMIN / CREATININE URINE RATIO
Creatinine, Urine: 56 mg/dL (ref 20–275)
Microalb, Ur: <0.2 mg/dL

## 2023-03-23 LAB — VITAMIN D 25 HYDROXY (VIT D DEFICIENCY, FRACTURES): Vit D, 25-Hydroxy: 61 ng/mL (ref 30–100)

## 2023-03-23 LAB — PTH, INTACT AND CALCIUM
Calcium: 11 mg/dL — ABNORMAL HIGH (ref 8.6–10.4)
PTH: 27 pg/mL (ref 16–77)

## 2023-03-23 LAB — LIPID PANEL
Cholesterol: 203 mg/dL — ABNORMAL HIGH (ref <200)
HDL: 70 mg/dL (ref >=50)
LDL Cholesterol (Calc): 104 mg/dL — ABNORMAL HIGH
Non-HDL Cholesterol (Calc): 133 mg/dL — ABNORMAL HIGH (ref <130)
Total CHOL/HDL Ratio: 2.9 (calc) (ref <5.0)
Triglycerides: 170 mg/dL — ABNORMAL HIGH (ref <150)

## 2023-03-23 LAB — MAGNESIUM: Magnesium: 2.3 mg/dL (ref 1.5–2.5)

## 2023-03-23 LAB — HEMOGLOBIN A1C W/OUT EAG: Hgb A1c MFr Bld: 5.7 %{Hb} — ABNORMAL HIGH (ref <5.7)

## 2023-04-10 ENCOUNTER — Other Ambulatory Visit: Payer: No Typology Code available for payment source

## 2023-04-12 ENCOUNTER — Other Ambulatory Visit: Payer: No Typology Code available for payment source

## 2023-04-12 ENCOUNTER — Other Ambulatory Visit: Payer: Self-pay

## 2023-04-12 DIAGNOSIS — E876 Hypokalemia: Secondary | ICD-10-CM

## 2023-04-13 LAB — BASIC METABOLIC PANEL WITH GFR
BUN: 16 mg/dL (ref 7–25)
CO2: 33 mmol/L — ABNORMAL HIGH (ref 20–32)
Calcium: 10.4 mg/dL (ref 8.6–10.4)
Chloride: 103 mmol/L (ref 98–110)
Creat: 1 mg/dL (ref 0.50–1.03)
Glucose, Bld: 91 mg/dL (ref 65–99)
Potassium: 4.2 mmol/L (ref 3.5–5.3)
Sodium: 143 mmol/L (ref 135–146)
eGFR: 68 mL/min/{1.73_m2} (ref >=60)

## 2023-05-06 ENCOUNTER — Other Ambulatory Visit: Payer: Self-pay | Admitting: Nurse Practitioner

## 2023-05-06 DIAGNOSIS — I10 Essential (primary) hypertension: Secondary | ICD-10-CM

## 2023-06-14 ENCOUNTER — Telehealth: Payer: Self-pay | Admitting: Nurse Practitioner

## 2023-06-14 ENCOUNTER — Other Ambulatory Visit: Payer: Self-pay | Admitting: Nurse Practitioner

## 2023-06-14 DIAGNOSIS — R238 Other skin changes: Secondary | ICD-10-CM

## 2023-06-14 MED ORDER — ACYCLOVIR 400 MG PO TABS: ORAL_TABLET | ORAL | 0 refills | Status: AC

## 2023-06-14 NOTE — Telephone Encounter (Signed)
 Patient is requesting a refill on Acyclovir  400 mg tabs to [facility] on [address] She will run out tomorrow. -e welch

## 2023-06-16 ENCOUNTER — Other Ambulatory Visit: Payer: Self-pay | Admitting: Family

## 2023-06-20 ENCOUNTER — Encounter: Payer: Self-pay | Admitting: *Deleted

## 2023-09-20 ENCOUNTER — Ambulatory Visit: Payer: No Typology Code available for payment source | Admitting: Nurse Practitioner

## 2024-03-21 ENCOUNTER — Encounter: Payer: No Typology Code available for payment source | Admitting: Nurse Practitioner

## 2024-05-11 ENCOUNTER — Other Ambulatory Visit: Payer: Self-pay | Admitting: Nurse Practitioner

## 2024-05-11 DIAGNOSIS — I10 Essential (primary) hypertension: Secondary | ICD-10-CM
